# Patient Record
Sex: Male | Born: 1980
Health system: Southern US, Community
[De-identification: ages and names within clinical notes are randomized; demographics above are authoritative.]

## PROBLEM LIST (undated history)

## (undated) DIAGNOSIS — D893 Immune reconstitution syndrome: Secondary | ICD-10-CM

## (undated) DIAGNOSIS — R945 Abnormal results of liver function studies: Secondary | ICD-10-CM

## (undated) DIAGNOSIS — B20 Human immunodeficiency virus [HIV] disease: Secondary | ICD-10-CM

## (undated) DIAGNOSIS — B079 Viral wart, unspecified: Principal | ICD-10-CM

## (undated) DIAGNOSIS — K5900 Constipation, unspecified: Secondary | ICD-10-CM

## (undated) DIAGNOSIS — B59 Pneumocystosis: Secondary | ICD-10-CM

## (undated) DIAGNOSIS — R509 Fever, unspecified: Secondary | ICD-10-CM

## (undated) DIAGNOSIS — R21 Rash and other nonspecific skin eruption: Secondary | ICD-10-CM

## (undated) DIAGNOSIS — R82998 Other abnormal findings in urine: Secondary | ICD-10-CM

## (undated) HISTORY — DX: Abnormal results of liver function studies: R94.5

## (undated) HISTORY — DX: Immune reconstitution syndrome: D89.3

## (undated) HISTORY — DX: Other abnormal findings in urine: R82.998

## (undated) HISTORY — DX: Viral wart, unspecified: B07.9

## (undated) HISTORY — DX: Human immunodeficiency virus (HIV) disease: B20

## (undated) HISTORY — DX: Constipation, unspecified: K59.00

## (undated) HISTORY — DX: Fever, unspecified: R50.9

## (undated) HISTORY — DX: Rash and other nonspecific skin eruption: R21

## (undated) HISTORY — DX: Pneumocystosis: B59

---

## 2006-09-03 ENCOUNTER — Ambulatory Visit: Payer: Self-pay | Admitting: Internal Medicine

## 2006-11-05 ENCOUNTER — Ambulatory Visit: Payer: Self-pay | Admitting: Infectious Diseases

## 2007-02-04 ENCOUNTER — Ambulatory Visit: Payer: Self-pay | Admitting: Internal Medicine

## 2007-05-06 ENCOUNTER — Ambulatory Visit: Payer: Self-pay | Admitting: Infectious Disease

## 2007-07-22 ENCOUNTER — Ambulatory Visit: Payer: Self-pay | Admitting: Infectious Diseases

## 2007-12-02 ENCOUNTER — Ambulatory Visit: Payer: Self-pay | Admitting: Internal Medicine

## 2008-02-03 ENCOUNTER — Ambulatory Visit: Payer: Self-pay | Admitting: Infectious Diseases

## 2008-06-08 ENCOUNTER — Ambulatory Visit: Payer: Self-pay | Admitting: Infectious Diseases

## 2008-08-03 ENCOUNTER — Ambulatory Visit: Payer: Self-pay | Admitting: Infectious Disease

## 2008-09-14 ENCOUNTER — Ambulatory Visit: Payer: Self-pay | Admitting: Internal Medicine

## 2008-12-22 ENCOUNTER — Ambulatory Visit: Payer: Self-pay | Admitting: Internal Medicine

## 2009-03-08 ENCOUNTER — Ambulatory Visit: Payer: Self-pay | Admitting: Internal Medicine

## 2009-05-03 ENCOUNTER — Ambulatory Visit: Payer: Self-pay | Admitting: Infectious Disease

## 2009-09-10 ENCOUNTER — Ambulatory Visit: Payer: Self-pay | Admitting: Internal Medicine

## 2009-12-20 ENCOUNTER — Ambulatory Visit: Payer: Self-pay | Admitting: Infectious Diseases

## 2010-10-18 ENCOUNTER — Other Ambulatory Visit: Payer: Self-pay | Admitting: Internal Medicine

## 2010-10-18 DIAGNOSIS — B20 Human immunodeficiency virus [HIV] disease: Secondary | ICD-10-CM

## 2011-02-26 DIAGNOSIS — B078 Other viral warts: Secondary | ICD-10-CM | POA: Insufficient documentation

## 2011-02-26 DIAGNOSIS — R85619 Unspecified abnormal cytological findings in specimens from anus: Secondary | ICD-10-CM | POA: Insufficient documentation

## 2011-02-26 DIAGNOSIS — B20 Human immunodeficiency virus [HIV] disease: Secondary | ICD-10-CM | POA: Insufficient documentation

## 2011-09-22 DIAGNOSIS — B20 Human immunodeficiency virus [HIV] disease: Secondary | ICD-10-CM

## 2013-05-24 DIAGNOSIS — Z8619 Personal history of other infectious and parasitic diseases: Secondary | ICD-10-CM | POA: Insufficient documentation

## 2014-03-15 DIAGNOSIS — K219 Gastro-esophageal reflux disease without esophagitis: Secondary | ICD-10-CM | POA: Insufficient documentation

## 2015-06-18 ENCOUNTER — Observation Stay (HOSPITAL_COMMUNITY): Payer: Medicaid Other

## 2015-06-18 ENCOUNTER — Inpatient Hospital Stay (HOSPITAL_COMMUNITY)
Admission: EM | Admit: 2015-06-18 | Discharge: 2015-06-21 | DRG: 689 | Disposition: A | Discharge status: Home / Self Care | Payer: Medicaid Other | Attending: Family Medicine | Admitting: Family Medicine

## 2015-06-18 ENCOUNTER — Encounter (HOSPITAL_COMMUNITY): Payer: Self-pay

## 2015-06-18 ENCOUNTER — Emergency Department (HOSPITAL_COMMUNITY): Payer: Medicaid Other

## 2015-06-18 DIAGNOSIS — R630 Anorexia: Secondary | ICD-10-CM | POA: Diagnosis present

## 2015-06-18 DIAGNOSIS — B20 Human immunodeficiency virus [HIV] disease: Secondary | ICD-10-CM | POA: Diagnosis present

## 2015-06-18 DIAGNOSIS — R519 Headache, unspecified: Secondary | ICD-10-CM

## 2015-06-18 DIAGNOSIS — B9620 Unspecified Escherichia coli [E. coli] as the cause of diseases classified elsewhere: Secondary | ICD-10-CM | POA: Diagnosis present

## 2015-06-18 DIAGNOSIS — R51 Headache: Secondary | ICD-10-CM | POA: Diagnosis present

## 2015-06-18 DIAGNOSIS — R634 Abnormal weight loss: Secondary | ICD-10-CM | POA: Diagnosis present

## 2015-06-18 DIAGNOSIS — N12 Tubulo-interstitial nephritis, not specified as acute or chronic: Principal | ICD-10-CM | POA: Diagnosis present

## 2015-06-18 DIAGNOSIS — Z87891 Personal history of nicotine dependence: Secondary | ICD-10-CM

## 2015-06-18 DIAGNOSIS — N390 Urinary tract infection, site not specified: Secondary | ICD-10-CM | POA: Diagnosis present

## 2015-06-18 DIAGNOSIS — Z6820 Body mass index (BMI) 20.0-20.9, adult: Secondary | ICD-10-CM

## 2015-06-18 DIAGNOSIS — Z9114 Patient's other noncompliance with medication regimen: Secondary | ICD-10-CM

## 2015-06-18 HISTORY — DX: Human immunodeficiency virus (HIV) disease: B20

## 2015-06-18 LAB — CBC WITH DIFFERENTIAL/PLATELET
BASOS ABS: 0 10*3/uL (ref 0.0–0.1)
Basophils Relative: 0 %
Eosinophils Absolute: 0 10*3/uL (ref 0.0–0.7)
Eosinophils Relative: 0 %
HEMATOCRIT: 40.5 % (ref 39.0–52.0)
HEMOGLOBIN: 13.8 g/dL (ref 13.0–17.0)
LYMPHS PCT: 12 %
Lymphs Abs: 1.2 10*3/uL (ref 0.7–4.0)
MCH: 32.1 pg (ref 26.0–34.0)
MCHC: 34.1 g/dL (ref 30.0–36.0)
MCV: 94.2 fL (ref 78.0–100.0)
MONO ABS: 0.5 10*3/uL (ref 0.1–1.0)
Monocytes Relative: 5 %
NEUTROS ABS: 8.5 10*3/uL — AB (ref 1.7–7.7)
Neutrophils Relative %: 83 %
Platelets: 167 10*3/uL (ref 150–400)
RBC: 4.3 MIL/uL (ref 4.22–5.81)
RDW: 13 % (ref 11.5–15.5)
WBC: 10.3 10*3/uL (ref 4.0–10.5)

## 2015-06-18 LAB — URINE MICROSCOPIC-ADD ON

## 2015-06-18 LAB — COMPREHENSIVE METABOLIC PANEL
ALK PHOS: 52 U/L (ref 38–126)
ALT: 11 U/L — AB (ref 17–63)
AST: 32 U/L (ref 15–41)
Albumin: 3.4 g/dL — ABNORMAL LOW (ref 3.5–5.0)
Anion gap: 14 (ref 5–15)
BILIRUBIN TOTAL: 0.9 mg/dL (ref 0.3–1.2)
BUN: 11 mg/dL (ref 6–20)
CO2: 26 mmol/L (ref 22–32)
CREATININE: 1.17 mg/dL (ref 0.61–1.24)
Calcium: 9 mg/dL (ref 8.9–10.3)
Chloride: 99 mmol/L — ABNORMAL LOW (ref 101–111)
GFR calc Af Amer: >60 mL/min (ref >60)
Glucose, Bld: 103 mg/dL — ABNORMAL HIGH (ref 65–99)
Potassium: 3.8 mmol/L (ref 3.5–5.1)
Sodium: 139 mmol/L (ref 135–145)
TOTAL PROTEIN: 8.7 g/dL — AB (ref 6.5–8.1)

## 2015-06-18 LAB — URINALYSIS, ROUTINE W REFLEX MICROSCOPIC
Bilirubin Urine: NEGATIVE
GLUCOSE, UA: NEGATIVE mg/dL
KETONES UR: NEGATIVE mg/dL
Nitrite: POSITIVE — AB
PH: 5.5 (ref 5.0–8.0)
PROTEIN: 100 mg/dL — AB
Specific Gravity, Urine: 1.019 (ref 1.005–1.030)

## 2015-06-18 LAB — I-STAT CG4 LACTIC ACID, ED
LACTIC ACID, VENOUS: 0.99 mmol/L (ref 0.5–2.0)
Lactic Acid, Venous: 1.21 mmol/L (ref 0.5–2.0)

## 2015-06-18 MED ORDER — HYDROCODONE-ACETAMINOPHEN 5-325 MG PO TABS
1.0000 | ORAL_TABLET | ORAL | Status: DC | PRN
  Administered 2015-06-19: 1 via ORAL
  Filled 2015-06-18: qty 2

## 2015-06-18 MED ORDER — SODIUM CHLORIDE 0.9 % IV SOLN
INTRAVENOUS | Status: DC
  Administered 2015-06-19 (×2): via INTRAVENOUS

## 2015-06-18 MED ORDER — ACETAMINOPHEN 325 MG PO TABS
650.0000 mg | ORAL_TABLET | Freq: Four times a day (QID) | ORAL | Status: DC | PRN
  Administered 2015-06-20 (×3): 650 mg via ORAL
  Filled 2015-06-18 (×3): qty 2

## 2015-06-18 MED ORDER — IBUPROFEN 800 MG PO TABS
800.0000 mg | ORAL_TABLET | Freq: Once | ORAL | Status: AC
  Administered 2015-06-18: 800 mg via ORAL
  Filled 2015-06-18: qty 1

## 2015-06-18 MED ORDER — SODIUM CHLORIDE 0.9 % IV BOLUS (SEPSIS)
1000.0000 mL | Freq: Once | INTRAVENOUS | Status: AC
  Administered 2015-06-18: 1000 mL via INTRAVENOUS

## 2015-06-18 MED ORDER — DEXTROSE 5 % IV SOLN: 1.0000 g | Freq: Once | INTRAVENOUS | Status: DC

## 2015-06-18 MED ORDER — ONDANSETRON HCL 4 MG PO TABS: 4.0000 mg | ORAL_TABLET | Freq: Four times a day (QID) | ORAL | Status: DC | PRN

## 2015-06-18 MED ORDER — ACETAMINOPHEN 650 MG RE SUPP: 650.0000 mg | Freq: Four times a day (QID) | RECTAL | Status: DC | PRN

## 2015-06-18 MED ORDER — ONDANSETRON HCL 4 MG/2ML IJ SOLN: 4.0000 mg | Freq: Four times a day (QID) | INTRAMUSCULAR | Status: DC | PRN

## 2015-06-18 MED ORDER — ACETAMINOPHEN 325 MG PO TABS
650.0000 mg | ORAL_TABLET | Freq: Once | ORAL | Status: AC
  Administered 2015-06-18: 650 mg via ORAL
  Filled 2015-06-18: qty 2

## 2015-06-18 MED ORDER — DEXTROSE 5 % IV SOLN
1.0000 g | Freq: Once | INTRAVENOUS | Status: AC
  Administered 2015-06-18: 1 g via INTRAVENOUS
  Filled 2015-06-18: qty 10

## 2015-06-18 MED ORDER — CEFTRIAXONE SODIUM 1 G IJ SOLR
1.0000 g | INTRAMUSCULAR | Status: DC
  Administered 2015-06-19 – 2015-06-20 (×2): 1 g via INTRAVENOUS
  Filled 2015-06-18 (×3): qty 10

## 2015-06-18 MED ORDER — SODIUM CHLORIDE 0.9 % IV SOLN: INTRAVENOUS | Status: DC

## 2015-06-18 NOTE — ED Provider Notes (Signed)
CSN: [id number]     Arrival date & time 06/18/15  1 History   First MD Initiated Contact with Patient 06/18/15 2006     Chief Complaint  Patient presents with  . Abdominal Pain  . Headache     (Consider location/radiation/quality/duration/timing/severity/associated sxs/prior Treatment) HPI  Patient presents to the emergency department for suprapubic, abdominal pain and headache, weight loss. He was diagnosed with HIV 8 years ago and has not taken antivirals for the past 4 years. He is treated at [facility] and last had his viral loads checked early this month 2017. He reports being told they were very low. I'm unable to access care everywhere to get the exact number. He says he has not been on antivirals because he does not like the side effects. He developed headache two weeks ago, dysuria 1 week ago and headache 2 days ago. He reports 15 lb weight loss between November and January. His providers have strongly encouraged him to start HIV medication but he has not felt comfortable with it and refuses. He says that he knows a lot of new medications are out but he see's a different provider every visit and doesn't have trust. He is looking to switch to Dr. Tommy Medal in [place] for continuity or provider. The patient is febrile at 101.4 and tachycardic at 122 on arrival. PCP: No PCP Per Patient  Brandon Hernandez is a 35 y.o.  male  ROS: The patient denies diaphoresis,weakness ( focal), confusion, change of vision,  dysphagia, aphagia, shortness of breath,  nausea, vomiting, diarrhea, lower extremity swelling, rash, neck pain, chest pain, penile drainage.   Past Medical History  Diagnosis Date  . HIV disease ([place])    History reviewed. No pertinent past surgical history. History reviewed. No pertinent family history. Social History  Substance Use Topics  . Smoking status: Former Research scientist (life sciences)  . Smokeless tobacco: None  . Alcohol Use: No    Review of Systems  Review of Systems All other  systems negative except as documented in the HPI. All pertinent positives and negatives as reviewed in the HPI.   Allergies  Review of patient's allergies indicates no known allergies.  Home Medications   Prior to Admission medications   Not on File   BP 105/79 mmHg  Pulse 91  Temp(Src) 101.6 F (38.7 C) (Oral)  Resp 18  SpO2 98% Physical Exam  Constitutional: He appears well-developed and well-nourished. No distress.  HENT:  Head: Normocephalic and atraumatic.  Right Ear: Tympanic membrane and ear canal normal.  Left Ear: Tympanic membrane and ear canal normal.  Nose: Nose normal.  Mouth/Throat: Uvula is midline, oropharynx is clear and moist and mucous membranes are normal.  Eyes: Pupils are equal, round, and reactive to light.  Neck: Normal range of motion. Neck supple.  Cardiovascular: Normal rate and regular rhythm.   Pulmonary/Chest: Effort normal.  Abdominal: Soft. Bowel sounds are normal. He exhibits no distension. There is tenderness in the epigastric area. There is no rigidity, no rebound, no guarding and no CVA tenderness.  No signs of abdominal distention  Musculoskeletal:  No LE swelling  Neurological: He is alert.  Acting at baseline  Skin: Skin is warm and dry. No rash noted.  Nursing note and vitals reviewed.   ED Course  Procedures (including critical care time) Labs Review Labs Reviewed  CBC WITH DIFFERENTIAL/PLATELET - Abnormal; Notable for the following:    Neutro Abs 8.5 (*)    All other components within normal limits  COMPREHENSIVE  METABOLIC PANEL - Abnormal; Notable for the following:    Chloride 99 (*)    Glucose, Bld 103 (*)    Total Protein 8.7 (*)    Albumin 3.4 (*)    ALT 11 (*)    All other components within normal limits  URINALYSIS, ROUTINE W REFLEX MICROSCOPIC (NOT AT [facility]) - Abnormal; Notable for the following:    APPearance CLOUDY (*)    Hgb urine dipstick SMALL (*)    Protein, ur 100 (*)    Nitrite POSITIVE (*)     Leukocytes, UA SMALL (*)    All other components within normal limits  URINE MICROSCOPIC-ADD ON - Abnormal; Notable for the following:    Squamous Epithelial / LPF 0-5 (*)    Bacteria, UA MANY (*)    Casts HYALINE CASTS (*)    All other components within normal limits  CULTURE, BLOOD (ROUTINE X 2)  CULTURE, BLOOD (ROUTINE X 2)  URINE CULTURE  T-HELPER CELLS (CD4) COUNT (NOT AT [facility])  I-STAT CG4 LACTIC ACID, ED  I-STAT CG4 LACTIC ACID, ED    Imaging Review Dg Chest 2 View  06/18/2015  CLINICAL DATA:  febrile, immune deficient. Chest pain, cough and fever X 2 days with nausea EXAM: CHEST  2 VIEW COMPARISON:  None. FINDINGS: Heart size is normal. Mild pectus deformity accentuates bronchovascular markings in the region of the right middle lobe. There are no focal consolidations or pleural effusions. No pulmonary edema. IMPRESSION: No evidence for acute cardiopulmonary abnormality. Electronically Signed   By: Nolon Nations M.D.   On: 06/18/2015 21:20   I have personally reviewed and evaluated these images and lab results as part of my medical decision-making.   EKG Interpretation None      MDM   Final diagnoses:  HIV (human immunodeficiency virus infection) ([place])  Complicated UTI (urinary tract infection)    Pt has UTI, immunocompromised with an unknown low CD4 count. No Care Everywhere but the patient says he goes to [facility]. It was confirmed by previous charts that he was treated by Dr. Lucianne Lei Dam/colleagues from 2008-2011 here in [place] on chart review.   Medications  sodium chloride 0.9 % bolus 1,000 mL (1,000 mLs Intravenous New Bag/Given 06/18/15 2125)  0.9 %  sodium chloride infusion (not administered)  ibuprofen (ADVIL,MOTRIN) tablet 800 mg (not administered)  sodium chloride 0.9 % bolus 1,000 mL (0 mLs Intravenous Stopped 06/18/15 2120)  acetaminophen (TYLENOL) tablet 650 mg (650 mg Oral Given 06/18/15 2025)  cefTRIAXone (ROCEPHIN) 1 g in dextrose 5 % 50 mL IVPB (0 g  Intravenous Stopped 06/18/15 2120)    Will require admission, unassigned, [facility] hospitalist, [facility] admits, Dr. Saverio Danker, A. Inpatient/ [place].  Filed Vitals:   06/18/15 2100 06/18/15 2130  BP: 104/68 105/79  Pulse: 90 91  Temp:    Resp:          Delos Haring, PA-C 06/18/15 [place], MD 06/19/15 [phone]

## 2015-06-18 NOTE — H&P (Addendum)
PCP: No PCP Per Patient  ID at [facility]   Referring provider Tiffany PA   Chief Complaint:  fever, abdominal pain HPI: Brandon Hernandez is a 35 y.o. male   has a past medical history of HIV disease ([place]).   Presented with abdominal pain and fevers x 1 day. Report headache for the past week. No neck pain, mild photophobia. Headache worse when trying to stand up. He is going to beauty school.  He has known history of HIV diagnosed 8 years ago not on any medications patient does state he is not complying due to side effects. Noticed burning with urination. Decreased PO intake. Reports 30 lb weight loss.     Hospitalist was called for admission for suspected AIDS and UTI  Review of Systems:    Pertinent positives include: Fevers, chills, fatigue, weight loss abdominal pain,    Constitutional:  No weight loss, night sweats  HEENT:  No headaches, Difficulty swallowing,Tooth/dental problems,Sore throat,  No sneezing, itching, ear ache, nasal congestion, post nasal drip,  Cardio-vascular:  No chest pain, Orthopnea, PND, anasarca, dizziness, palpitations.no Bilateral lower extremity swelling  GI:  No heartburn, indigestion,  change in bowel habits, loss of appetite, melena, blood in stool, hematemesis Resp:  no shortness of breath at rest. No dyspnea on exertion, No excess mucus, no productive cough, No non-productive cough, No coughing up of blood.No change in color of mucus.No wheezing. Skin:  no rash or lesions. No jaundice GU:  no dysuria, change in color of urine, no urgency or frequency. No straining to urinate.  No flank pain.  Musculoskeletal:  No joint pain or no joint swelling. No decreased range of motion. No back pain.  Psych:  No change in mood or affect. No depression or anxiety. No memory loss.  Neuro: no localizing neurological complaints, no tingling, no weakness, no double vision, no gait abnormality, no slurred speech, no confusion  Otherwise ROS are negative  except for above, 10 systems were reviewed  Past Medical History: Past Medical History  Diagnosis Date  . HIV disease ([place])    History reviewed. No pertinent past surgical history.   Medications: Prior to Admission medications   Not on File    Allergies:  No Known Allergies  Social History:  Ambulatory  independently  Lives at home alone,    reports that he has quit smoking. He does not have any smokeless tobacco history on file. He reports that he does not drink alcohol or use illicit drugs.    Family History: family history includes Cancer in his father and mother; Diabetes in his sister.    Physical Exam: Patient Vitals for the past 24 hrs:  BP Temp Temp src Pulse Resp SpO2  06/18/15 2130 105/79 mmHg - - 91 - 98 %  06/18/15 2100 104/68 mmHg - - 90 - 97 %  06/18/15 2045 113/74 mmHg - - 95 - 97 %  06/18/15 2030 105/73 mmHg - - 94 - 98 %  06/18/15 2015 105/75 mmHg - - 97 - 98 %  06/18/15 2000 109/68 mmHg - - 97 - 96 %  06/18/15 1945 108/73 mmHg - - 98 - 96 %  06/18/15 1930 111/77 mmHg - - 98 - 98 %  06/18/15 1853 106/69 mmHg 101.6 F (38.7 C) Oral 113 18 98 %  06/18/15 1556 106/72 mmHg 101.4 F (38.6 C) Oral (!) 122 18 96 %    1. General:  in No Acute distress 2. Psychological: Alert and Oriented 3.  Head/ENT:    Dry Mucous Membranes                          Head Non traumatic, neck supple                          Normal   Dentition 4. SKIN:   decreased Skin turgor,  Skin clean Dry and intact no rash 5. Heart: Regular rate and rhythm no Murmur, Rub or gallop 6. Lungs: Clear to auscultation bilaterally, no wheezes or crackles   7. Abdomen: Soft, non-tender, Non distended 8. Lower extremities: no clubbing, cyanosis, or edema 9. Neurologically Grossly intact, moving all 4 extremities equally 10. MSK: Normal range of motion  body mass index is unknown because there is no height or weight on file.   Labs on Admission:   Results for orders placed or  performed during the hospital encounter of 06/18/15 (from the past 24 hour(s))  CBC with Differential     Status: Abnormal   Collection Time: 06/18/15  4:19 PM  Result Value Ref Range   WBC 10.3 4.0 - 10.5 K/uL   RBC 4.30 4.22 - 5.81 MIL/uL   Hemoglobin 13.8 13.0 - 17.0 g/dL   HCT 40.5 39.0 - 52.0 %   MCV 94.2 78.0 - 100.0 fL   MCH 32.1 26.0 - 34.0 pg   MCHC 34.1 30.0 - 36.0 g/dL   RDW 13.0 11.5 - 15.5 %   Platelets 167 150 - 400 K/uL   Neutrophils Relative % 83 %   Neutro Abs 8.5 (H) 1.7 - 7.7 K/uL   Lymphocytes Relative 12 %   Lymphs Abs 1.2 0.7 - 4.0 K/uL   Monocytes Relative 5 %   Monocytes Absolute 0.5 0.1 - 1.0 K/uL   Eosinophils Relative 0 %   Eosinophils Absolute 0.0 0.0 - 0.7 K/uL   Basophils Relative 0 %   Basophils Absolute 0.0 0.0 - 0.1 K/uL  Comprehensive metabolic panel     Status: Abnormal   Collection Time: 06/18/15  4:19 PM  Result Value Ref Range   Sodium 139 135 - 145 mmol/L   Potassium 3.8 3.5 - 5.1 mmol/L   Chloride 99 (L) 101 - 111 mmol/L   CO2 26 22 - 32 mmol/L   Glucose, Bld 103 (H) 65 - 99 mg/dL   BUN 11 6 - 20 mg/dL   Creatinine, Ser 1.17 0.61 - 1.24 mg/dL   Calcium 9.0 8.9 - 10.3 mg/dL   Total Protein 8.7 (H) 6.5 - 8.1 g/dL   Albumin 3.4 (L) 3.5 - 5.0 g/dL   AST 32 15 - 41 U/L   ALT 11 (L) 17 - 63 U/L   Alkaline Phosphatase 52 38 - 126 U/L   Total Bilirubin 0.9 0.3 - 1.2 mg/dL   GFR calc non Af Amer >60 >60 mL/min   GFR calc Af Amer >60 >60 mL/min   Anion gap 14 5 - 15  I-Stat CG4 Lactic Acid, ED     Status: None   Collection Time: 06/18/15  4:19 PM  Result Value Ref Range   Lactic Acid, Venous 0.99 0.5 - 2.0 mmol/L  Urinalysis, Routine w reflex microscopic (not at [facility])     Status: Abnormal   Collection Time: 06/18/15  4:20 PM  Result Value Ref Range   Color, Urine YELLOW YELLOW   APPearance CLOUDY (A) CLEAR   Specific Gravity, Urine 1.019 1.005 -  1.030   pH 5.5 5.0 - 8.0   Glucose, UA NEGATIVE NEGATIVE mg/dL   Hgb urine dipstick  SMALL (A) NEGATIVE   Bilirubin Urine NEGATIVE NEGATIVE   Ketones, ur NEGATIVE NEGATIVE mg/dL   Protein, ur 100 (A) NEGATIVE mg/dL   Nitrite POSITIVE (A) NEGATIVE   Leukocytes, UA SMALL (A) NEGATIVE  Urine microscopic-add on     Status: Abnormal   Collection Time: 06/18/15  4:20 PM  Result Value Ref Range   Squamous Epithelial / LPF 0-5 (A) NONE SEEN   WBC, UA 6-30 0 - 5 WBC/hpf   RBC / HPF 0-5 0 - 5 RBC/hpf   Bacteria, UA MANY (A) NONE SEEN   Casts HYALINE CASTS (A) NEGATIVE  I-Stat CG4 Lactic Acid, ED     Status: None   Collection Time: 06/18/15  7:44 PM  Result Value Ref Range   Lactic Acid, Venous 1.21 0.5 - 2.0 mmol/L    UA 6-30 white blood cells many bacteria and positive nitrite  No results found for: HGBA1C  CrCl cannot be calculated (Unknown ideal weight.).  BNP (last 3 results) No results for input(s): PROBNP in the last 8760 hours.  Other results:  I have pearsonaly reviewed this: ECG REPORT  Not obtained   There were no vitals filed for this visit.   Cultures: No results found for: [place], [place], CULT, REPTSTATUS   Radiological Exams on Admission: Dg Chest 2 View  06/18/2015  CLINICAL DATA:  febrile, immune deficient. Chest pain, cough and fever X 2 days with nausea EXAM: CHEST  2 VIEW COMPARISON:  None. FINDINGS: Heart size is normal. Mild pectus deformity accentuates bronchovascular markings in the region of the right middle lobe. There are no focal consolidations or pleural effusions. No pulmonary edema. IMPRESSION: No evidence for acute cardiopulmonary abnormality. Electronically Signed   By: Nolon Nations M.D.   On: 06/18/2015 21:20    Chart has been reviewed  Family   at  Bedside    Assessment/Plan  35 year old gentleman with history of HIV untreated likely AIDS presents with fevers chills headache abdominal pain evidence of UTI, ID aware will see in consult tomorrow  Present on Admission:  . AIDS (acquired immune deficiency syndrome)  ([place]) - discussed with infectious disease at this point patient isn't interested in taking medications. We will obtain CD4 count and HIV viral load,  infectious disease consult in AM Headache no evidence of Meningitis or encephalitis will obtain CT of the head and rehydrate, check cryptococcal antigen . Complicated UTI (urinary tract infection) - in the setting of immunocompromise    Prophylaxis: SCD   CODE STATUS:  FULL CODE   as per patient   Disposition:  To home once workup is complete and patient is stable  Other plan as per orders.  I have spent a total of 63 min on this admission extra time was spent to discuss case with ID dr. Mertie Moores 06/18/2015, 11:09 PM    [facility]  Pager [phone]   after 2 AM please page floor coverage PA If 7AM-7PM, please contact the day team taking care of the patient  [email or web address]  Password [email or web address]

## 2015-06-18 NOTE — ED Notes (Signed)
Patient here with 1 week of abdominal pain and light headedness, reports some nausea with same.  Patient states that he has HIV and takes no medication.

## 2015-06-19 ENCOUNTER — Ambulatory Visit: Payer: Self-pay | Admitting: *Deleted

## 2015-06-19 DIAGNOSIS — R51 Headache: Secondary | ICD-10-CM | POA: Diagnosis present

## 2015-06-19 DIAGNOSIS — B20 Human immunodeficiency virus [HIV] disease: Secondary | ICD-10-CM

## 2015-06-19 DIAGNOSIS — Z87891 Personal history of nicotine dependence: Secondary | ICD-10-CM | POA: Diagnosis not present

## 2015-06-19 DIAGNOSIS — R634 Abnormal weight loss: Secondary | ICD-10-CM | POA: Diagnosis present

## 2015-06-19 DIAGNOSIS — B9689 Other specified bacterial agents as the cause of diseases classified elsewhere: Secondary | ICD-10-CM

## 2015-06-19 DIAGNOSIS — Z6820 Body mass index (BMI) 20.0-20.9, adult: Secondary | ICD-10-CM | POA: Diagnosis not present

## 2015-06-19 DIAGNOSIS — B9620 Unspecified Escherichia coli [E. coli] as the cause of diseases classified elsewhere: Secondary | ICD-10-CM | POA: Diagnosis present

## 2015-06-19 DIAGNOSIS — N390 Urinary tract infection, site not specified: Secondary | ICD-10-CM | POA: Diagnosis not present

## 2015-06-19 DIAGNOSIS — Z9114 Patient's other noncompliance with medication regimen: Secondary | ICD-10-CM | POA: Diagnosis not present

## 2015-06-19 DIAGNOSIS — N12 Tubulo-interstitial nephritis, not specified as acute or chronic: Secondary | ICD-10-CM | POA: Diagnosis present

## 2015-06-19 DIAGNOSIS — R630 Anorexia: Secondary | ICD-10-CM | POA: Diagnosis present

## 2015-06-19 DIAGNOSIS — Z21 Asymptomatic human immunodeficiency virus [HIV] infection status: Secondary | ICD-10-CM

## 2015-06-19 LAB — MAGNESIUM: MAGNESIUM: 2.2 mg/dL (ref 1.7–2.4)

## 2015-06-19 LAB — T-HELPER CELLS (CD4) COUNT (NOT AT ARMC)
CD4 % Helper T Cell: 2 % — ABNORMAL LOW (ref 33–55)
CD4 T Cell Abs: 20 /uL — ABNORMAL LOW (ref 400–2700)

## 2015-06-19 LAB — CBC
HCT: 37.7 % — ABNORMAL LOW (ref 39.0–52.0)
HEMATOCRIT: 33.4 % — AB (ref 39.0–52.0)
Hemoglobin: 13.3 g/dL (ref 13.0–17.0)
Hemoglobin: 11.2 g/dL — ABNORMAL LOW (ref 13.0–17.0)
MCH: 33.1 pg (ref 26.0–34.0)
MCH: 31.6 pg (ref 26.0–34.0)
MCHC: 35.3 g/dL (ref 30.0–36.0)
MCHC: 33.5 g/dL (ref 30.0–36.0)
MCV: 93.8 fL (ref 78.0–100.0)
MCV: 94.4 fL (ref 78.0–100.0)
PLATELETS: 151 10*3/uL (ref 150–400)
Platelets: 141 10*3/uL — ABNORMAL LOW (ref 150–400)
RBC: 4.02 MIL/uL — ABNORMAL LOW (ref 4.22–5.81)
RBC: 3.54 MIL/uL — ABNORMAL LOW (ref 4.22–5.81)
RDW: 13.2 % (ref 11.5–15.5)
RDW: 13 % (ref 11.5–15.5)
WBC: 5.8 10*3/uL (ref 4.0–10.5)
WBC: 4.4 10*3/uL (ref 4.0–10.5)

## 2015-06-19 LAB — COMPREHENSIVE METABOLIC PANEL
ALT: 10 U/L — ABNORMAL LOW (ref 17–63)
ANION GAP: 7 (ref 5–15)
AST: 29 U/L (ref 15–41)
Albumin: 2.7 g/dL — ABNORMAL LOW (ref 3.5–5.0)
Alkaline Phosphatase: 42 U/L (ref 38–126)
BILIRUBIN TOTAL: 0.7 mg/dL (ref 0.3–1.2)
BUN: 11 mg/dL (ref 6–20)
CALCIUM: 7.8 mg/dL — AB (ref 8.9–10.3)
CO2: 27 mmol/L (ref 22–32)
CREATININE: 0.92 mg/dL (ref 0.61–1.24)
Chloride: 109 mmol/L (ref 101–111)
GLUCOSE: 83 mg/dL (ref 65–99)
Potassium: 3.7 mmol/L (ref 3.5–5.1)
Sodium: 143 mmol/L (ref 135–145)
Total Protein: 7.5 g/dL (ref 6.5–8.1)

## 2015-06-19 LAB — CRYPTOCOCCAL ANTIGEN: CRYPTO AG: NEGATIVE

## 2015-06-19 LAB — PHOSPHORUS: PHOSPHORUS: 3 mg/dL (ref 2.5–4.6)

## 2015-06-19 LAB — TSH: TSH: 7.893 u[IU]/mL — AB (ref 0.350–4.500)

## 2015-06-19 MED ORDER — SODIUM CHLORIDE 0.9 % IV SOLN
INTRAVENOUS | Status: DC
  Administered 2015-06-19 – 2015-06-20 (×2): via INTRAVENOUS

## 2015-06-19 MED ORDER — WHITE PETROLATUM GEL
Status: AC
  Filled 2015-06-19: qty 1

## 2015-06-19 NOTE — Consult Note (Signed)
[place] for Infectious Disease       Reason for Consult: HIV/AIDS    Referring Physician: Dr. Waldron Labs  Active Problems:   AIDS (acquired immune deficiency syndrome) ([place])   UTI (lower urinary tract infection)   Complicated UTI (urinary tract infection)   . cefTRIAXone (ROCEPHIN)  IV  1 g Intravenous Q24H    Recommendations: Treat UTI 5 days with oral therapy once organism known We will have our case manager get him in for HIV/AIDS care   Assessment: He has AIDS with a reported CD4 of 10 last month at [facility].  Nothing in [place] so may be false or he may be under a different name at [facility].  Regardless, he prefers his care here since he is moving here and we will get him into our clinic.   uTI with increased WBCs, symptoms, bacteria, now improving  Antibiotics: ceftriaxone  HPI: Brandon Hernandez is a 35 y.o. male with HIV, likely AIDs with a self-reported CD4 of 10 last month.  He has been on medications in the past, though does not remember any names, will take them for a month or two and stop due to headache. He also tells me he is moving here to [place], though will be staying with his sister in [place] temporarily.  He is currently jobless.  He reports a recent 30 lb weight loss.  No diarrhea.  Came in with dysuria, headache, feels better.  No discharge.  Reports one stable partner for 2 years.  Does not want to return to [facility] since he does not like having a different provider every time.   CT scan head independently reviewed and no significant findings noted.  Review of Systems:  Constitutional: negative for chills and malaise Cardiovascular: negative for chest pressure/discomfort Gastrointestinal: negative for nausea and diarrhea; no dysphagia Integument/breast: negative for rash All other systems reviewed and are negative   Past Medical History  Diagnosis Date  . HIV disease [facility])     Social History  Substance Use Topics  . Smoking  status: Former Research scientist (life sciences)  . Smokeless tobacco: None  . Alcohol Use: No    Family History  Problem Relation Age of Onset  . Cancer Mother   . Cancer Father   . Diabetes Sister     No Known Allergies  Physical Exam: Constitutional: in no apparent distress and alert  Filed Vitals:   06/19/15 0412 06/19/15 0653  BP: 103/52   Pulse: 51   Temp: 91.9 F (33.3 C) 95.9 F (35.5 C)  Resp: 16    EYES: anicteric ENMT: no thrush Cardiovascular: Cor RRR and No murmurs Respiratory: CTA B; normal respiratory effort GI: Bowel sounds are normal, liver is not enlarged, spleen is not enlarged Musculoskeletal: peripheral pulses normal, no pedal edema, no clubbing or cyanosis Skin: negatives: no rash Hematologic: no cervical lad  Lab Results  Component Value Date   WBC 5.8 06/19/2015   HGB 13.3 06/19/2015   HCT 37.7* 06/19/2015   MCV 93.8 06/19/2015   PLT 141* 06/19/2015    Lab Results  Component Value Date   CREATININE 0.92 06/19/2015   BUN 11 06/19/2015   NA 143 06/19/2015   K 3.7 06/19/2015   CL 109 06/19/2015   CO2 27 06/19/2015    Lab Results  Component Value Date   ALT 10* 06/19/2015   AST 29 06/19/2015   ALKPHOS 42 06/19/2015     Microbiology: Recent Results (from the past 240 hour(s))  Urine  culture     Status: None (Preliminary result)   Collection Time: 06/18/15  4:20 PM  Result Value Ref Range Status   Specimen Description URINE, RANDOM  Final   Special Requests Immunocompromised  Final   Culture TOO YOUNG TO READ  Final   Report Status PENDING  Incomplete    Scharlene Gloss, [place] for Infectious [email or web address] [phone] pager  [phone] cell 06/19/2015, 1:03 PM

## 2015-06-19 NOTE — Progress Notes (Signed)
Unable to obtain  temperature orally and axillary,rectal temp 95.9, sweating profusely, urine output 200 cc only. Md on call paged, awaiting response.

## 2015-06-19 NOTE — Progress Notes (Signed)
Patient Demographics  Brandon Hernandez, is a 35 y.o. male, DOB - 1980-10-14, [id number]  Admit date - 06/18/2015   Admitting Physician Toy Baker, MD  Outpatient Primary MD for the patient is No PCP Per Patient  LOS - 1   Chief Complaint  Patient presents with  . Abdominal Pain  . Headache       Admission HPI/Brief narrative: 35 year old male with known HIV, possibly AIDS, followed at [facility], but receiving any treatment, resents with fatigue and fever, workup significant for UTI, followed by ID. Subjective:   Arnette Felts today has, complains of fatigue, sweating, and poor appetite   Assessment & Plan    Active Problems:   AIDS (acquired immune deficiency syndrome) (HCC)   UTI (lower urinary tract infection)   Complicated UTI (urinary tract infection)  HIV/AIDS  - ID consult greatly appreciated, patient reports CD4 of 10 , no records at care everywhere . - Patient preferred to start following at our ID clinic . - CD4 count of 20  - HIV quantitative pending   UTI  - January the IV Rocephin, follow on urine culture, then to continue by mouth antibiotic once organism known as per ID recommendation .  Code Status: Full  Family Communication: none at bedside  Disposition Plan: home when stable   Procedures  none   Consults   ID   Medications  Scheduled Meds: . cefTRIAXone (ROCEPHIN)  IV  1 g Intravenous Q24H   Continuous Infusions:  PRN Meds:.acetaminophen **OR** acetaminophen, HYDROcodone-acetaminophen, ondansetron **OR** ondansetron (ZOFRAN) IV  DVT Prophylaxis  SCDs   Lab Results  Component Value Date   PLT 141* 06/19/2015    Antibiotics   Anti-infectives    Start     Dose/Rate Route Frequency Ordered Stop   06/19/15 2000  cefTRIAXone (ROCEPHIN) 1 g in dextrose 5 % 50 mL IVPB     1 g 100 mL/hr over 30 Minutes Intravenous Every 24  hours 06/18/15 2315     06/18/15 2030  cefTRIAXone (ROCEPHIN) 1 g in dextrose 5 % 50 mL IVPB  Status:  Discontinued     1 g 100 mL/hr over 30 Minutes Intravenous  Once 06/18/15 2016 06/18/15 2020   06/18/15 2030  cefTRIAXone (ROCEPHIN) 1 g in dextrose 5 % 50 mL IVPB     1 g 100 mL/hr over 30 Minutes Intravenous  Once 06/18/15 2021 06/18/15 2120          Objective:   Filed Vitals:   06/18/15 2319 06/19/15 0412 06/19/15 0653 06/19/15 1533  BP: 106/51 103/52  105/64  Pulse: 98 51  92  Temp:  91.9 F (33.3 C) 95.9 F (35.5 C) 101.6 F (38.7 C)  TempSrc:  Oral Rectal Oral  Resp:  16  18  Height:      Weight:      SpO2:  100%  98%    Wt Readings from Last 3 Encounters:  06/18/15 65.318 kg (144 lb)     Intake/Output Summary (Last 24 hours) at 06/19/15 1608 Last data filed at 06/19/15 0640  Gross per 24 hour  Intake      0 ml  Output    675 ml  Net   -675 ml  Physical Exam  Awake Alert, Oriented X 3, No new F.N deficits, Normal affect [place].AT,PERRAL Supple Neck,No JVD,  Symmetrical Chest wall movement, Good air movement bilaterally, CTAB RRR,No Gallops,Rubs or new Murmurs, No Parasternal Heave +ve B.Sounds, Abd Soft, No tenderness, No organomegaly appriciated, No rebound - guarding or rigidity. No Cyanosis, Clubbing or edema, No new Rash or bruise     Data Review   Micro Results Recent Results (from the past 240 hour(s))  Urine culture     Status: None (Preliminary result)   Collection Time: 06/18/15  4:20 PM  Result Value Ref Range Status   Specimen Description URINE, RANDOM  Final   Special Requests Immunocompromised  Final   Culture TOO YOUNG TO READ  Final   Report Status PENDING  Incomplete  Culture, blood (Routine X 2) w Reflex to ID Panel     Status: None (Preliminary result)   Collection Time: 06/18/15  8:22 PM  Result Value Ref Range Status   Specimen Description BLOOD RIGHT ANTECUBITAL  Final   Special Requests BOTTLES DRAWN AEROBIC ONLY 10CC   Final   Culture NO GROWTH < 24 HOURS  Final   Report Status PENDING  Incomplete  Culture, blood (Routine X 2) w Reflex to ID Panel     Status: None (Preliminary result)   Collection Time: 06/18/15  8:26 PM  Result Value Ref Range Status   Specimen Description BLOOD LEFT ANTECUBITAL  Final   Special Requests BOTTLES DRAWN AEROBIC ONLY 10CC  Final   Culture NO GROWTH < 24 HOURS  Final   Report Status PENDING  Incomplete    Radiology Reports Dg Chest 2 View  06/18/2015  CLINICAL DATA:  febrile, immune deficient. Chest pain, cough and fever X 2 days with nausea EXAM: CHEST  2 VIEW COMPARISON:  None. FINDINGS: Heart size is normal. Mild pectus deformity accentuates bronchovascular markings in the region of the right middle lobe. There are no focal consolidations or pleural effusions. No pulmonary edema. IMPRESSION: No evidence for acute cardiopulmonary abnormality. Electronically Signed   By: Nolon Nations M.D.   On: 06/18/2015 21:20   Ct Head Wo Contrast  06/19/2015  CLINICAL DATA:  Headaches and photophobia, worse when standing. History of HIV. 30 pound weight loss. EXAM: CT HEAD WITHOUT CONTRAST TECHNIQUE: Contiguous axial images were obtained from the base of the skull through the vertex without intravenous contrast. COMPARISON:  None. FINDINGS: Ventricles and sulci appear symmetrical. No mass effect or midline shift. No abnormal extra-axial fluid collections. Gray-white matter junctions are distinct. Basal cisterns are not effaced. No evidence of acute intracranial hemorrhage. No depressed skull fractures. Opacification of bilateral ethmoid air cells. Mastoid air cells are not opacified. IMPRESSION: No acute intracranial abnormalities. Opacification of scattered bilateral ethmoid air cells. Electronically Signed   By: Lucienne Capers M.D.   On: 06/19/2015 00:03     CBC  Recent Labs Lab 06/18/15 1619 06/19/15 0637  WBC 10.3 5.8  HGB 13.8 13.3  HCT 40.5 37.7*  PLT 167 141*  MCV 94.2  93.8  MCH 32.1 33.1  MCHC 34.1 35.3  RDW 13.0 13.2  LYMPHSABS 1.2  --   MONOABS 0.5  --   EOSABS 0.0  --   BASOSABS 0.0  --     Chemistries   Recent Labs Lab 06/18/15 1619 06/19/15 0637  NA 139 143  K 3.8 3.7  CL 99* 109  CO2 26 27  GLUCOSE 103* 83  BUN 11 11  CREATININE 1.17 0.92  CALCIUM 9.0 7.8*  MG  --  2.2  AST 32 29  ALT 11* 10*  ALKPHOS 52 42  BILITOT 0.9 0.7   ------------------------------------------------------------------------------------------------------------------ estimated creatinine clearance is 106.5 mL/min (by C-G formula based on Cr of 0.92). ------------------------------------------------------------------------------------------------------------------ No results for input(s): HGBA1C in the last 72 hours. ------------------------------------------------------------------------------------------------------------------ No results for input(s): CHOL, HDL, LDLCALC, TRIG, CHOLHDL, LDLDIRECT in the last 72 hours. ------------------------------------------------------------------------------------------------------------------  Recent Labs  06/19/15 0637  TSH 7.893*   ------------------------------------------------------------------------------------------------------------------ No results for input(s): VITAMINB12, FOLATE, FERRITIN, TIBC, IRON, RETICCTPCT in the last 72 hours.  Coagulation profile No results for input(s): INR, PROTIME in the last 168 hours.  No results for input(s): DDIMER in the last 72 hours.  Cardiac Enzymes No results for input(s): CKMB, TROPONINI, MYOGLOBIN in the last 168 hours.  Invalid input(s): CK ------------------------------------------------------------------------------------------------------------------ Invalid input(s): POCBNP     Time Spent in minutes   25 Lacinda Axon, Deniya Craigo M.D on 06/19/2015 at 4:08 PM  Between 7am to 7pm - Pager - [phone]  After 7pm go to [email or web address] - password  [facility]  [facility]   Office  [phone]

## 2015-06-20 DIAGNOSIS — B9620 Unspecified Escherichia coli [E. coli] as the cause of diseases classified elsewhere: Secondary | ICD-10-CM

## 2015-06-20 DIAGNOSIS — Z9114 Patient's other noncompliance with medication regimen: Secondary | ICD-10-CM

## 2015-06-20 DIAGNOSIS — R509 Fever, unspecified: Secondary | ICD-10-CM

## 2015-06-20 LAB — HIV-1 RNA QUANT-NO REFLEX-BLD
HIV 1 RNA Quant: 743000 copies/mL
LOG10 HIV-1 RNA: 5.871 {Log_copies}/mL

## 2015-06-20 LAB — BASIC METABOLIC PANEL
ANION GAP: 7 (ref 5–15)
BUN: 5 mg/dL — ABNORMAL LOW (ref 6–20)
CALCIUM: 7.8 mg/dL — AB (ref 8.9–10.3)
CHLORIDE: 103 mmol/L (ref 101–111)
CO2: 25 mmol/L (ref 22–32)
Creatinine, Ser: 1.01 mg/dL (ref 0.61–1.24)
GFR calc non Af Amer: >60 mL/min (ref >60)
GLUCOSE: 92 mg/dL (ref 65–99)
POTASSIUM: 3.6 mmol/L (ref 3.5–5.1)
Sodium: 135 mmol/L (ref 135–145)

## 2015-06-20 LAB — HEPATITIS PANEL, ACUTE
HCV Ab: 0.1 s/co ratio (ref 0.0–0.9)
Hep A IgM: NEGATIVE
Hep B C IgM: NEGATIVE
Hepatitis B Surface Ag: NEGATIVE

## 2015-06-20 NOTE — Progress Notes (Signed)
[facility] PROGRESS NOTE  Brandon Hernandez [id number] DOB: [birth year] DOA: 06/18/2015 PCP: No PCP Per Patient  Assessment/Plan: 1. AIDS- patient's CD4 count is 20, viral load 743,000. Patient now agrees to take antiretroviral therapy. Will follow up with ID as outpatient 2. UTI- urine culture is growing Escherichia coli, continue with IV Rocephin. We will follow the urine culture results. 3. DVT prophylaxis-  SCD  Code Status: Full code Family Communication: No family at bedside Disposition Plan: Home when culture results back   Consultants:  Infectious disease  Procedures:  None  Antibiotics:  Ceftriaxone  HPI/Subjective: 35 year old male with known HIV, possibly AIDS, followed at [facility], but receiving any treatment, resents with fatigue and fever, workup significant for UTI, followed by ID.  Patient complains of intermittent headache. Denies headache at this time. CT head negative for acute abnormality   Objective: Filed Vitals:   06/20/15 0543 06/20/15 1012  BP:  103/47  Pulse:  83  Temp: 102.6 F (39.2 C) 98 F (36.7 C)  Resp:  18    Intake/Output Summary (Last 24 hours) at 06/20/15 1558 Last data filed at 06/20/15 1014  Gross per 24 hour  Intake   2115 ml  Output    600 ml  Net   1515 ml   Filed Weights   06/18/15 2311  Weight: 65.318 kg (144 lb)    Exam:   General:  Appears in no acute distress  Cardiovascular: S1-S2 regular  Respiratory: Clear to auscultation bilaterally  Abdomen: Soft, nontender, no organomegaly  Musculoskeletal: No cyanosis/clubbing/edema. Lower extremities   Data Reviewed: Basic Metabolic Panel:  Recent Labs Lab 06/18/15 1619 06/19/15 0637 06/20/15 0348  NA 139 143 135  K 3.8 3.7 3.6  CL 99* 109 103  CO2 26 27 25   GLUCOSE 103* 83 92  BUN 11 11 5*  CREATININE 1.17 0.92 1.01  CALCIUM 9.0 7.8* 7.8*  MG  --  2.2  --   PHOS  --  3.0  --    Liver Function Tests:  Recent Labs Lab  06/18/15 1619 06/19/15 0637  AST 32 29  ALT 11* 10*  ALKPHOS 52 42  BILITOT 0.9 0.7  PROT 8.7* 7.5  ALBUMIN 3.4* 2.7*   No results for input(s): LIPASE, AMYLASE in the last 168 hours. No results for input(s): AMMONIA in the last 168 hours. CBC:  Recent Labs Lab 06/18/15 1619 06/19/15 0637 06/19/15 1840  WBC 10.3 5.8 4.4  NEUTROABS 8.5*  --   --   HGB 13.8 13.3 11.2*  HCT 40.5 37.7* 33.4*  MCV 94.2 93.8 94.4  PLT 167 141* 151   Cardiac Enzymes: No results for input(s): CKTOTAL, CKMB, CKMBINDEX, TROPONINI in the last 168 hours. BNP (last 3 results) No results for input(s): BNP in the last 8760 hours.  ProBNP (last 3 results) No results for input(s): PROBNP in the last 8760 hours.  CBG: No results for input(s): GLUCAP in the last 168 hours.  Recent Results (from the past 240 hour(s))  Urine culture     Status: None (Preliminary result)   Collection Time: 06/18/15  4:20 PM  Result Value Ref Range Status   Specimen Description URINE, RANDOM  Final   Special Requests Immunocompromised  Final   Culture >=100,000 COLONIES/mL ESCHERICHIA COLI  Final   Report Status PENDING  Incomplete  Culture, blood (Routine X 2) w Reflex to ID Panel     Status: None (Preliminary result)   Collection Time: 06/18/15  8:22 PM  Result Value Ref Range Status   Specimen Description BLOOD RIGHT ANTECUBITAL  Final   Special Requests BOTTLES DRAWN AEROBIC ONLY 10CC  Final   Culture NO GROWTH 2 DAYS  Final   Report Status PENDING  Incomplete  Culture, blood (Routine X 2) w Reflex to ID Panel     Status: None (Preliminary result)   Collection Time: 06/18/15  8:26 PM  Result Value Ref Range Status   Specimen Description BLOOD LEFT ANTECUBITAL  Final   Special Requests BOTTLES DRAWN AEROBIC ONLY 10CC  Final   Culture NO GROWTH 2 DAYS  Final   Report Status PENDING  Incomplete     Studies: Dg Chest 2 View  06/18/2015  CLINICAL DATA:  febrile, immune deficient. Chest pain, cough and fever X 2  days with nausea EXAM: CHEST  2 VIEW COMPARISON:  None. FINDINGS: Heart size is normal. Mild pectus deformity accentuates bronchovascular markings in the region of the right middle lobe. There are no focal consolidations or pleural effusions. No pulmonary edema. IMPRESSION: No evidence for acute cardiopulmonary abnormality. Electronically Signed   By: Nolon Nations M.D.   On: 06/18/2015 21:20   Ct Head Wo Contrast  06/19/2015  CLINICAL DATA:  Headaches and photophobia, worse when standing. History of HIV. 30 pound weight loss. EXAM: CT HEAD WITHOUT CONTRAST TECHNIQUE: Contiguous axial images were obtained from the base of the skull through the vertex without intravenous contrast. COMPARISON:  None. FINDINGS: Ventricles and sulci appear symmetrical. No mass effect or midline shift. No abnormal extra-axial fluid collections. Gray-white matter junctions are distinct. Basal cisterns are not effaced. No evidence of acute intracranial hemorrhage. No depressed skull fractures. Opacification of bilateral ethmoid air cells. Mastoid air cells are not opacified. IMPRESSION: No acute intracranial abnormalities. Opacification of scattered bilateral ethmoid air cells. Electronically Signed   By: Lucienne Capers M.D.   On: 06/19/2015 00:03    Scheduled Meds: . cefTRIAXone (ROCEPHIN)  IV  1 g Intravenous Q24H   Continuous Infusions: . sodium chloride 150 mL/hr at 06/20/15 [id number]    Active Problems:   AIDS (acquired immune deficiency syndrome) ([place])   UTI (lower urinary tract infection)   Complicated UTI (urinary tract infection)    Time spent: 25 min    [facility]  [facility] Pager [phone]*. If 7PM-7AM, please contact night-coverage at [email or web address], password [facility] 06/20/2015, 3:58 PM  LOS: 2 days

## 2015-06-20 NOTE — Progress Notes (Signed)
[facility] for Infectious Disease   Reason for visit: Follow up on HIV/ UTI  Interval History: has continued fever with Tmax to 103.  Culture with E coli.  Poor appetite.  CD4 is 20, viral load 743,000.  No headache.    Physical Exam: Constitutional:  Filed Vitals:   06/20/15 0543 06/20/15 1012  BP:  103/47  Pulse:  83  Temp: 102.6 F (39.2 C) 98 F (36.7 C)  Resp:  18   patient appears in NAD Eyes: anicteric HENT: no thrush Respiratory: Normal respiratory effort; CTA B Cardiovascular: RRR GI: soft, nt, nd  Review of Systems: Constitutional: negative for chills Gastrointestinal: positive for constipation, negative for nausea and diarrhea Integument/breast: negative for rash  Lab Results  Component Value Date   WBC 4.4 06/19/2015   HGB 11.2* 06/19/2015   HCT 33.4* 06/19/2015   MCV 94.4 06/19/2015   PLT 151 06/19/2015    Lab Results  Component Value Date   CREATININE 1.01 06/20/2015   BUN 5* 06/20/2015   NA 135 06/20/2015   K 3.6 06/20/2015   CL 103 06/20/2015   CO2 25 06/20/2015    Lab Results  Component Value Date   ALT 10* 06/19/2015   AST 29 06/19/2015   ALKPHOS 42 06/19/2015     Microbiology: Recent Results (from the past 240 hour(s))  Urine culture     Status: None (Preliminary result)   Collection Time: 06/18/15  4:20 PM  Result Value Ref Range Status   Specimen Description URINE, RANDOM  Final   Special Requests Immunocompromised  Final   Culture >=100,000 COLONIES/mL ESCHERICHIA COLI  Final   Report Status PENDING  Incomplete  Culture, blood (Routine X 2) w Reflex to ID Panel     Status: None (Preliminary result)   Collection Time: 06/18/15  8:22 PM  Result Value Ref Range Status   Specimen Description BLOOD RIGHT ANTECUBITAL  Final   Special Requests BOTTLES DRAWN AEROBIC ONLY 10CC  Final   Culture NO GROWTH 2 DAYS  Final   Report Status PENDING  Incomplete  Culture, blood (Routine X 2) w Reflex to ID Panel     Status: None  (Preliminary result)   Collection Time: 06/18/15  8:26 PM  Result Value Ref Range Status   Specimen Description BLOOD LEFT ANTECUBITAL  Final   Special Requests BOTTLES DRAWN AEROBIC ONLY 10CC  Final   Culture NO GROWTH 2 DAYS  Final   Report Status PENDING  Incomplete    Impression/Plan:  1. Pyelonephritis - has high fever, urine symptoms and growth on culture c/w pyelonephritis.  Continue with ceftriaxone.  Can change to oral therapy tomorrow if sensitive and treat for 7 more days.   2. HIV/AIDS - poor compliance with medications previously.  Now interested in treatment and wants to have a stable provider rather than a new one each time (different fellow at [facility]).  Will commit to treatment and I discussed with him that he very well may have fatigue, diarrhea, headache when initiating treatment since his immune system is so low and needs to continue with ARVs once he starts.   He also was seen by our case manager RN Ambre here in the hospital.   Will check genotypes, HLA Hep C negative Has appt scheduled with [name] next week

## 2015-06-21 LAB — URINE CULTURE: Culture: >=100000

## 2015-06-21 MED ORDER — HYDROCODONE-ACETAMINOPHEN 5-325 MG PO TABS: 1.0000 | ORAL_TABLET | ORAL | Status: DC | PRN

## 2015-06-21 MED ORDER — CEPHALEXIN 500 MG PO CAPS
500.0000 mg | ORAL_CAPSULE | Freq: Two times a day (BID) | ORAL | Status: DC
Start: 2015-06-21 — End: 2015-06-26

## 2015-06-21 NOTE — Care Management Note (Signed)
Case Management Note  Patient Details  Name: Brandon Hernandez MRN: [id number] Date of Birth: 1981-01-07  Subjective/Objective:                    Action/Plan:   Expected Discharge Date:                  Expected Discharge Plan:  Home/Self Care  In-House Referral:     Discharge planning Services  [facility], Medication Assistance  Post Acute Care Choice:    Choice offered to:     DME Arranged:    DME Agency:     HH Arranged:    HH Agency:     Status of Service:  Completed, signed off  Medicare Important Message Given:    Date Medicare IM Given:    Medicare IM give by:    Date Additional Medicare IM Given:    Additional Medicare Important Message give by:     If discussed at [place] of Stay Meetings, dates discussed:    Additional Comments:  Marilu Favre, RN 06/21/2015, 11:15 AM

## 2015-06-21 NOTE — Discharge Summary (Signed)
Physician Discharge Summary  Brandon Hernandez [id number] DOB: Jul 01, 1980 DOA: 06/18/2015  PCP: No PCP Per Patient  Admit date: 06/18/2015 Discharge date: 06/21/2015  Time spent: *25 minutes  Recommendations for Outpatient Follow-up:  1. Follow-up infectious disease in 2 weeks   Discharge Diagnoses:  Active Problems:   AIDS (acquired immune deficiency syndrome) (HCC)   UTI (lower urinary tract infection)   Complicated UTI (urinary tract infection)   Discharge Condition: Stable  Diet recommendation: Regular diet  Filed Weights   06/18/15 2311  Weight: 65.318 kg (144 lb)    History of present illness:  Presented with abdominal pain and fevers x 1 day. Report headache for the past week. No neck pain, mild photophobia. Headache worse when trying to stand up. He is going to beauty school. He has known history of HIV diagnosed 8 years ago not on any medications patient does state he is not complying due to side effects. Noticed burning with urination.   Hospital Course:  1. AIDS- patient's CD4 count is 20, viral load 743,000. Patient now agrees to take antiretroviral therapy. Will follow up with ID as outpatient 2. UTI- urine culture is growing Escherichia coli patient was started on  IV Rocephin. Urine culture shows Escherichia coli sensitive to Cefazolin, will discharge the patient on Keflex 500 by mouth twice a day for 7 more days as per ID recommendation.  Procedures:  None  Consultations:  Infectious disease  Discharge Exam: Filed Vitals:   06/20/15 2300 06/21/15 0610  BP: 115/67 96/49  Pulse: 85 84  Temp: 100.2 F (37.9 C) 98.9 F (37.2 C)  Resp: 18 16    General: Appears in no acute distress Cardiovascular: S1-S2 regular Respiratory: Clear to auscultation bilaterally  Discharge Instructions   Discharge Instructions    Diet - low sodium heart healthy    Complete by:  As directed      Increase activity slowly    Complete by:  As directed            Current Discharge Medication List    START taking these medications   Details  cephALEXin (KEFLEX) 500 MG capsule Take 1 capsule (500 mg total) by mouth 2 (two) times daily. Qty: 14 capsule, Refills: 0    HYDROcodone-acetaminophen (NORCO/VICODIN) 5-325 MG tablet Take 1-2 tablets by mouth every 4 (four) hours as needed for moderate pain. Qty: 30 tablet, Refills: 0       No Known Allergies    The results of significant diagnostics from this hospitalization (including imaging, microbiology, ancillary and laboratory) are listed below for reference.    Significant Diagnostic Studies: Dg Chest 2 View  06/18/2015  CLINICAL DATA:  febrile, immune deficient. Chest pain, cough and fever X 2 days with nausea EXAM: CHEST  2 VIEW COMPARISON:  None. FINDINGS: Heart size is normal. Mild pectus deformity accentuates bronchovascular markings in the region of the right middle lobe. There are no focal consolidations or pleural effusions. No pulmonary edema. IMPRESSION: No evidence for acute cardiopulmonary abnormality. Electronically Signed   By: Nolon Nations M.D.   On: 06/18/2015 21:20   Ct Head Wo Contrast  06/19/2015  CLINICAL DATA:  Headaches and photophobia, worse when standing. History of HIV. 30 pound weight loss. EXAM: CT HEAD WITHOUT CONTRAST TECHNIQUE: Contiguous axial images were obtained from the base of the skull through the vertex without intravenous contrast. COMPARISON:  None. FINDINGS: Ventricles and sulci appear symmetrical. No mass effect or midline shift. No abnormal extra-axial fluid collections. Gray-white matter  junctions are distinct. Basal cisterns are not effaced. No evidence of acute intracranial hemorrhage. No depressed skull fractures. Opacification of bilateral ethmoid air cells. Mastoid air cells are not opacified. IMPRESSION: No acute intracranial abnormalities. Opacification of scattered bilateral ethmoid air cells. Electronically Signed   By: Lucienne Capers M.D.   On:  06/19/2015 00:03    Microbiology: Recent Results (from the past 240 hour(s))  Urine culture     Status: None   Collection Time: 06/18/15  4:20 PM  Result Value Ref Range Status   Specimen Description URINE, RANDOM  Final   Special Requests Immunocompromised  Final   Culture >=100,000 COLONIES/mL ESCHERICHIA COLI  Final   Report Status 06/21/2015 FINAL  Final   Organism ID, Bacteria ESCHERICHIA COLI  Final      Susceptibility   Escherichia coli - MIC*    AMPICILLIN >=32 RESISTANT Resistant     CEFAZOLIN <=4 SENSITIVE Sensitive     CEFTRIAXONE <=1 SENSITIVE Sensitive     CIPROFLOXACIN <=0.25 SENSITIVE Sensitive     GENTAMICIN <=1 SENSITIVE Sensitive     IMIPENEM <=0.25 SENSITIVE Sensitive     NITROFURANTOIN <=16 SENSITIVE Sensitive     TRIMETH/SULFA >=320 RESISTANT Resistant     AMPICILLIN/SULBACTAM 16 INTERMEDIATE Intermediate     PIP/TAZO <=4 SENSITIVE Sensitive     * >=100,000 COLONIES/mL ESCHERICHIA COLI  Culture, blood (Routine X 2) w Reflex to ID Panel     Status: None (Preliminary result)   Collection Time: 06/18/15  8:22 PM  Result Value Ref Range Status   Specimen Description BLOOD RIGHT ANTECUBITAL  Final   Special Requests BOTTLES DRAWN AEROBIC ONLY 10CC  Final   Culture NO GROWTH 2 DAYS  Final   Report Status PENDING  Incomplete  Culture, blood (Routine X 2) w Reflex to ID Panel     Status: None (Preliminary result)   Collection Time: 06/18/15  8:26 PM  Result Value Ref Range Status   Specimen Description BLOOD LEFT ANTECUBITAL  Final   Special Requests BOTTLES DRAWN AEROBIC ONLY 10CC  Final   Culture NO GROWTH 2 DAYS  Final   Report Status PENDING  Incomplete     Labs: Basic Metabolic Panel:  Recent Labs Lab 06/18/15 1619 06/19/15 0637 06/20/15 0348  NA 139 143 135  K 3.8 3.7 3.6  CL 99* 109 103  CO2 26 27 25   GLUCOSE 103* 83 92  BUN 11 11 5*  CREATININE 1.17 0.92 1.01  CALCIUM 9.0 7.8* 7.8*  MG  --  2.2  --   PHOS  --  3.0  --    Liver Function  Tests:  Recent Labs Lab 06/18/15 1619 06/19/15 0637  AST 32 29  ALT 11* 10*  ALKPHOS 52 42  BILITOT 0.9 0.7  PROT 8.7* 7.5  ALBUMIN 3.4* 2.7*   No results for input(s): LIPASE, AMYLASE in the last 168 hours. No results for input(s): AMMONIA in the last 168 hours. CBC:  Recent Labs Lab 06/18/15 1619 06/19/15 0637 06/19/15 1840  WBC 10.3 5.8 4.4  NEUTROABS 8.5*  --   --   HGB 13.8 13.3 11.2*  HCT 40.5 37.7* 33.4*  MCV 94.2 93.8 94.4  PLT 167 141* 151       Signed:  Khristopher Kapaun S MD.  [facility] 06/21/2015, 10:35 AM

## 2015-06-21 NOTE — Progress Notes (Signed)
Arnette Felts to be D/C'd home  per MD order. Discussed with the patient and all questions fully answered.  VSS, Skin clean, dry and intact without evidence of skin break down, no evidence of skin tears noted.  IV catheter discontinued intact. Site without signs and symptoms of complications. Dressing and pressure applied.  An After Visit Summary was printed and given to the patient. Patient received prescriptions and match letter.  D/c education completed with patient/family including follow up instructions, medication list, d/c activities limitations if indicated, with other d/c instructions as indicated by MD - patient able to verbalize understanding, all questions fully answered.   Patient instructed to return to ED, call 911, or call MD for any changes in condition.   Patient to be escorted via [place], and D/C home via private auto.

## 2015-06-23 LAB — CULTURE, BLOOD (ROUTINE X 2)
CULTURE: NO GROWTH
Culture: NO GROWTH

## 2015-06-26 ENCOUNTER — Encounter: Payer: Self-pay | Admitting: Infectious Disease

## 2015-06-26 ENCOUNTER — Ambulatory Visit (INDEPENDENT_AMBULATORY_CARE_PROVIDER_SITE_OTHER): Payer: Self-pay | Admitting: Infectious Disease

## 2015-06-26 ENCOUNTER — Encounter: Payer: Self-pay | Admitting: *Deleted

## 2015-06-26 VITALS — BP 112/76 | HR 88 | Temp 100.1°F | Ht 71.0 in | Wt 150.0 lb

## 2015-06-26 DIAGNOSIS — B59 Pneumocystosis: Secondary | ICD-10-CM

## 2015-06-26 DIAGNOSIS — N390 Urinary tract infection, site not specified: Secondary | ICD-10-CM

## 2015-06-26 DIAGNOSIS — B20 Human immunodeficiency virus [HIV] disease: Secondary | ICD-10-CM

## 2015-06-26 DIAGNOSIS — R509 Fever, unspecified: Secondary | ICD-10-CM

## 2015-06-26 DIAGNOSIS — K5900 Constipation, unspecified: Secondary | ICD-10-CM

## 2015-06-26 HISTORY — DX: Pneumocystosis: B59

## 2015-06-26 HISTORY — DX: Constipation, unspecified: K59.00

## 2015-06-26 HISTORY — DX: Human immunodeficiency virus (HIV) disease: B20

## 2015-06-26 HISTORY — DX: Fever, unspecified: R50.9

## 2015-06-26 MED ORDER — SULFAMETHOXAZOLE-TRIMETHOPRIM 800-160 MG PO TABS: 1.0000 | ORAL_TABLET | Freq: Two times a day (BID) | ORAL | Status: DC

## 2015-06-26 MED ORDER — DOLUTEGRAVIR SODIUM 50 MG PO TABS: 50.0000 mg | ORAL_TABLET | Freq: Every day | ORAL | Status: DC

## 2015-06-26 MED ORDER — EMTRICITAB-RILPIVIR-TENOFOV AF 200-25-25 MG PO TABS: 1.0000 | ORAL_TABLET | Freq: Every day | ORAL | Status: DC

## 2015-06-26 NOTE — Progress Notes (Signed)
Chief complaint: constipation, light headedness, dizziness   Subjective:    Patient ID: Brandon Hernandez, male    DOB: 07/11/1980, 35 y.o.   MRN: [id number]  HPI  35 year old [place] American male with HIV/AIDS who was initially treated for his HIV by our group in [place]--though I cannot locate his records on [place] his name was VERY familiar to me and I am sure this is correct. He describes having been on Atripla then switched to various other regimens. He had been in care at [facility] and most recently seen by ID there in mid January and was not wanting to restart ARV's. Per their notes he had been on STRIBILD Genvoya and before that Prezista/Norvir and Truvada but never with proper virological success. I could not locate genotypes from [facility]. He has one pending from our hospital when he was hospitalized from 06/19/15 through 06/21/15 with fevers, and malaise and had been rx for UTI.   Viral load was well above 700K and CD4 abysmally low.  He indicates to me today that he would be willing to start ARV. He perseverated on STR but I told him that was not likely a good idea in his case. After extensive discussion we decided to go with Tivicay and ODEFSEY with fool and avoiding PPI and H2 blockers.  He is complaining. Of lightheadedness , dizziness at times, as well as constipation and fevers.  Past Medical History  Diagnosis Date  . HIV disease ([place])   . AIDS ([place]) 06/26/2015  . PCP (pneumocystis carinii pneumonia) ([place]) 06/26/2015  . Constipation 06/26/2015  . FUO (fever of unknown origin) 06/26/2015    No past surgical history on file.  Family History  Problem Relation Age of Onset  . Cancer Mother   . Cancer Father   . Diabetes Sister       Social History   Social History  . Marital Status: Single    Spouse Name: N/A  . Number of Children: N/A  . Years of Education: N/A   Social History Main Topics  . Smoking status: Former Research scientist (life sciences)  . Smokeless tobacco: None  .  Alcohol Use: No  . Drug Use: No  . Sexual Activity: Not Asked   Other Topics Concern  . None   Social History Narrative    No Known Allergies   Current outpatient prescriptions:  .  dolutegravir (TIVICAY) 50 MG tablet, Take 1 tablet (50 mg total) by mouth daily., Disp: 30 tablet, Rfl: 11 .  emtricitabine-rilpivir-tenofovir AF (ODEFSEY) 200-25-25 MG TABS tablet, Take 1 tablet by mouth daily with breakfast., Disp: 30 tablet, Rfl: 11 .  HYDROcodone-acetaminophen (NORCO/VICODIN) 5-325 MG tablet, Take 1-2 tablets by mouth every 4 (four) hours as needed for moderate pain. (Patient not taking: Reported on 06/26/2015), Disp: 30 tablet, Rfl: 0 .  sulfamethoxazole-trimethoprim (BACTRIM DS,SEPTRA DS) 800-160 MG tablet, Take 1 tablet by mouth 2 (two) times daily., Disp: 30 tablet, Rfl: 11    Review of Systems  Constitutional: Positive for fever, chills, diaphoresis, activity change, fatigue and unexpected weight change. Negative for appetite change.  HENT: Negative for congestion, rhinorrhea, sinus pressure, sneezing, sore throat and trouble swallowing.   Eyes: Negative for photophobia and visual disturbance.  Respiratory: Positive for shortness of breath. Negative for cough, chest tightness, wheezing and stridor.   Cardiovascular: Negative for chest pain, palpitations and leg swelling.  Gastrointestinal: Positive for constipation. Negative for nausea, vomiting, abdominal pain, diarrhea, blood in stool, abdominal distention and anal bleeding.  Genitourinary: Negative for dysuria, hematuria, flank pain and difficulty urinating.  Musculoskeletal: Negative for myalgias, back pain, joint swelling, arthralgias and gait problem.  Skin: Negative for color change, pallor, rash and wound.  Neurological: Positive for dizziness, weakness and light-headedness. Negative for tremors.  Hematological: Negative for adenopathy. Does not bruise/bleed easily.  Psychiatric/Behavioral: Negative for behavioral  problems, confusion, sleep disturbance, dysphoric mood, decreased concentration and agitation.       Objective:   Physical Exam  Constitutional: He is oriented to person, place, and time.  HENT:  Head: Normocephalic and atraumatic.  Eyes: Conjunctivae and EOM are normal.  Neck: Normal range of motion. Neck supple.  Cardiovascular: Normal rate, regular rhythm and normal heart sounds.  Exam reveals no gallop and no friction rub.   No murmur heard. Pulmonary/Chest: Effort normal. No respiratory distress. He has no wheezes. He has no rales. He exhibits no tenderness.  Abdominal: Soft. He exhibits no distension.  Musculoskeletal: Normal range of motion. He exhibits no edema or tenderness.  Neurological: He is alert and oriented to person, place, and time.  Skin: Skin is warm and dry.  Psychiatric: He has a normal mood and affect. His behavior is normal. Judgment and thought content normal.  Nursing note and vitals reviewed.         Assessment & Plan:   HIV/AIDS:   We will start Tivicay and ODEFSEY. I will get records from [place] re HIV genotypes and also from [place] Bactrim DS daily he will likely need azithromycin for M avium prophylaxis as I am skeptical he will be able to reconstitute his immune system rapidly  FUO with constipation, malaise: high risk for having M avium and will check AFB blood cultures  Lightheadedness dizziness: could be due to his HIV itself. He could have Adrenal Insufficiency  I spent greater than 40 minutes with the patient including greater than 50% of time in face to face counsel of the patient re his HIV/AIDS, fevers, constipation. Light headedness dizziness and in coordination of their care.

## 2015-06-27 ENCOUNTER — Telehealth: Payer: Self-pay | Admitting: *Deleted

## 2015-06-27 LAB — HLA B*5701: HLA B 5701: NEGATIVE

## 2015-06-27 NOTE — Telephone Encounter (Signed)
Referred to [facility]. Landis Gandy, RN

## 2015-06-27 NOTE — Progress Notes (Signed)
Patient ID: Brandon Hernandez, male   DOB: 07/23/1980, 35 y.o.   MRN: [id number] RN conducted a hospital visit with the patient during this current admit to [facility]. I introduced myself by name and explained the reason for my visit is to add additional support at this time with hopes of continuing to see him once he is discharged. I explained to Mr. Brandon Hernandez that I am a extension of the [facility] and can offer him assistance with removing any barriers he may have to taking his medications. Patient stated he has been going up and down with taking his medications. Mr. Brandon Hernandez stated he was taking Herbal remedies to manage his HIV but could no longer afford the medication. RN stated it is a great thing that [employer] covers the cost of is medications and they are also proven to be effective with suppressing the HIV virus. Patient agreed stating at times he does well but when he stopped taking his medications his partner did the same.  Mr. Brandon Hernandez spoke about his most recent relationship and the struggles him and his partner went through. Currently he plans to move in with his sister in [place] until his housing voucher can be transferred to [employer]. Patient states his family is aware of his status and are supportive. Currently he does not see the need for my services. Mr. Brandon Hernandez was given my contact information and asked to please keep in touch with me. RN advised the patient that he I may be able to be of service to him in the future. Mr. Brandon Hernandez thanked me for my time and for listening to him

## 2015-06-29 LAB — REFLEX TO GENOSURE(R) MG
HIV GENOSURE(R) MG PDF: UNDETERMINED
HIV GenoSure(R): UNDETERMINED
HIV GenoSure: UNDETERMINED

## 2015-06-29 LAB — HIV-1 RNA ULTRAQUANT REFLEX TO GENTYP+
HIV-1 RNA BY PCR: 532000 {copies}/mL
HIV-1 RNA QUANT, LOG: 5.726 {Log_copies}/mL

## 2015-07-01 LAB — HIV-1 INTEGRASE GENOTYPE
Date Viral Load Collected: NO GROWTH
Value last viral load: NO GROWTH

## 2015-07-03 ENCOUNTER — Telehealth: Payer: Self-pay | Admitting: Infectious Disease

## 2015-07-03 NOTE — Telephone Encounter (Signed)
Need to get records from [place] and from [place]

## 2015-07-09 ENCOUNTER — Other Ambulatory Visit: Payer: Self-pay | Admitting: *Deleted

## 2015-07-09 ENCOUNTER — Telehealth: Payer: Self-pay | Admitting: *Deleted

## 2015-07-09 DIAGNOSIS — R350 Frequency of micturition: Secondary | ICD-10-CM

## 2015-07-09 NOTE — Telephone Encounter (Signed)
Patient notified and will come for urine test 07/10/15 in am. Labs added.

## 2015-07-09 NOTE — Telephone Encounter (Signed)
Patient called to advise he is having painful urination, frequency and pressure when he does not have to urinate. He advised no discharge but feels like it did last time he had a recent UTI.

## 2015-07-09 NOTE — Telephone Encounter (Signed)
Please check UA, UCx, urine gc/chlamydia

## 2015-07-10 ENCOUNTER — Other Ambulatory Visit: Payer: Self-pay

## 2015-07-10 ENCOUNTER — Other Ambulatory Visit: Payer: Self-pay | Admitting: *Deleted

## 2015-07-10 DIAGNOSIS — B20 Human immunodeficiency virus [HIV] disease: Secondary | ICD-10-CM

## 2015-07-10 DIAGNOSIS — Z113 Encounter for screening for infections with a predominantly sexual mode of transmission: Secondary | ICD-10-CM

## 2015-07-10 DIAGNOSIS — R350 Frequency of micturition: Secondary | ICD-10-CM

## 2015-07-11 ENCOUNTER — Encounter: Payer: Self-pay | Admitting: *Deleted

## 2015-07-11 LAB — URINALYSIS, MICROSCOPIC ONLY
CASTS: NONE SEEN [LPF]
CRYSTALS: NONE SEEN [HPF]
SQUAMOUS EPITHELIAL / LPF: NONE SEEN [HPF] (ref <=5)
WBC, UA: >60 WBC/HPF — AB (ref <=5)
YEAST: NONE SEEN [HPF]

## 2015-07-11 LAB — URINALYSIS, ROUTINE W REFLEX MICROSCOPIC
BILIRUBIN URINE: NEGATIVE
Glucose, UA: NEGATIVE
Hgb urine dipstick: NEGATIVE
KETONES UR: NEGATIVE
NITRITE: POSITIVE — AB
Protein, ur: NEGATIVE
Specific Gravity, Urine: 1.014 (ref 1.001–1.035)
pH: 7 (ref 5.0–8.0)

## 2015-07-11 LAB — URINE CYTOLOGY ANCILLARY ONLY
CHLAMYDIA, DNA PROBE: NEGATIVE
Neisseria Gonorrhea: NEGATIVE

## 2015-07-11 NOTE — Telephone Encounter (Signed)
Awaiting Ucx prior to anbx unless pt is clinically unwell Will ask Ms Brandon Hernandez to inform if this is the case

## 2015-07-11 NOTE — Telephone Encounter (Signed)
Called patient and advised him we are waiting on the Culture to make a decision. He advised he is uncomfortable but can wait until tomorrow for the culture to result. Advised will give him a call asap.

## 2015-07-11 NOTE — Telephone Encounter (Signed)
Error

## 2015-07-11 NOTE — Telephone Encounter (Signed)
Patient called to get the results of his recent urine studies. Advised still waiting on the urine culture but will ask the doctor to look at them and will give him a call back asap.

## 2015-07-12 ENCOUNTER — Other Ambulatory Visit: Payer: Self-pay | Admitting: *Deleted

## 2015-07-12 ENCOUNTER — Other Ambulatory Visit: Payer: Self-pay | Admitting: Infectious Diseases

## 2015-07-12 DIAGNOSIS — N390 Urinary tract infection, site not specified: Secondary | ICD-10-CM

## 2015-07-12 MED ORDER — LEVOFLOXACIN 500 MG PO TABS: 500.0000 mg | ORAL_TABLET | Freq: Every day | ORAL | Status: AC

## 2015-07-12 MED ORDER — LEVOFLOXACIN 500 MG PO TABS: 500.0000 mg | ORAL_TABLET | Freq: Every day | ORAL | Status: DC

## 2015-07-13 LAB — URINE CULTURE: Colony Count: >=100000

## 2015-07-16 NOTE — Telephone Encounter (Signed)
Corresponded with Benjamine Mola. Per Dr. Loletta Parish.They did not do genotypes at [facility].

## 2015-07-30 ENCOUNTER — Other Ambulatory Visit: Payer: Medicaid Other

## 2015-07-30 ENCOUNTER — Other Ambulatory Visit (HOSPITAL_COMMUNITY)
Admission: RE | Admit: 2015-07-30 | Discharge: 2015-07-30 | Disposition: A | Discharge status: Home / Self Care | Payer: Medicaid Other | Source: Ambulatory Visit | Attending: Infectious Disease | Admitting: Infectious Disease

## 2015-07-30 DIAGNOSIS — Z79899 Other long term (current) drug therapy: Secondary | ICD-10-CM

## 2015-07-30 DIAGNOSIS — Z113 Encounter for screening for infections with a predominantly sexual mode of transmission: Secondary | ICD-10-CM | POA: Insufficient documentation

## 2015-07-30 DIAGNOSIS — B20 Human immunodeficiency virus [HIV] disease: Secondary | ICD-10-CM

## 2015-07-30 LAB — COMPLETE METABOLIC PANEL WITH GFR
ALT: 93 U/L — ABNORMAL HIGH (ref 9–46)
AST: 68 U/L — AB (ref 10–40)
Albumin: 3.4 g/dL — ABNORMAL LOW (ref 3.6–5.1)
Alkaline Phosphatase: 88 U/L (ref 40–115)
BUN: 7 mg/dL (ref 7–25)
CALCIUM: 9.2 mg/dL (ref 8.6–10.3)
CHLORIDE: 101 mmol/L (ref 98–110)
CO2: 27 mmol/L (ref 20–31)
Creat: 0.92 mg/dL (ref 0.60–1.35)
GFR, Est African American: >89 mL/min (ref >=60)
GFR, Est Non African American: >89 mL/min (ref >=60)
Glucose, Bld: 97 mg/dL (ref 65–99)
POTASSIUM: 3.9 mmol/L (ref 3.5–5.3)
SODIUM: 138 mmol/L (ref 135–146)
Total Bilirubin: 0.5 mg/dL (ref 0.2–1.2)
Total Protein: 8.5 g/dL — ABNORMAL HIGH (ref 6.1–8.1)

## 2015-07-30 LAB — LIPID PANEL
CHOL/HDL RATIO: 3.7 ratio (ref <=5.0)
Cholesterol: 170 mg/dL (ref 125–200)
HDL: 46 mg/dL (ref >=40)
LDL Cholesterol: 105 mg/dL (ref <130)
Triglycerides: 97 mg/dL (ref <150)
VLDL: 19 mg/dL (ref <30)

## 2015-07-30 LAB — CBC WITH DIFFERENTIAL/PLATELET
Basophils Absolute: 0.1 10*3/uL (ref 0.0–0.1)
Basophils Relative: 1 % (ref 0–1)
Eosinophils Absolute: 1.1 10*3/uL — ABNORMAL HIGH (ref 0.0–0.7)
Eosinophils Relative: 18 % — ABNORMAL HIGH (ref 0–5)
HCT: 38.8 % — ABNORMAL LOW (ref 39.0–52.0)
HEMOGLOBIN: 13 g/dL (ref 13.0–17.0)
LYMPHS ABS: 2.5 10*3/uL (ref 0.7–4.0)
LYMPHS PCT: 42 % (ref 12–46)
MCH: 32.2 pg (ref 26.0–34.0)
MCHC: 33.5 g/dL (ref 30.0–36.0)
MCV: 96 fL (ref 78.0–100.0)
MONOS PCT: 9 % (ref 3–12)
MPV: 9.5 fL (ref 8.6–12.4)
Monocytes Absolute: 0.5 10*3/uL (ref 0.1–1.0)
NEUTROS ABS: 1.8 10*3/uL (ref 1.7–7.7)
NEUTROS PCT: 30 % — AB (ref 43–77)
Platelets: 267 10*3/uL (ref 150–400)
RBC: 4.04 MIL/uL — ABNORMAL LOW (ref 4.22–5.81)
RDW: 16.2 % — ABNORMAL HIGH (ref 11.5–15.5)
WBC: 5.9 10*3/uL (ref 4.0–10.5)

## 2015-07-31 LAB — T-HELPER CELL (CD4) - (RCID CLINIC ONLY)
CD4 T CELL HELPER: 7 % — AB (ref 33–55)
CD4 T Cell Abs: 200 /uL — ABNORMAL LOW (ref 400–2700)

## 2015-07-31 LAB — RPR

## 2015-07-31 LAB — URINE CYTOLOGY ANCILLARY ONLY
Chlamydia: NEGATIVE
Neisseria Gonorrhea: NEGATIVE

## 2015-08-02 LAB — HIV RNA, RTPCR W/R GT (RTI, PI,INT)
HIV 1 RNA QUANT: 289 {copies}/mL — AB
HIV-1 RNA Quant, Log: 2.46 Log copies/mL — ABNORMAL HIGH

## 2015-08-09 LAB — AFB CULTURE, BLOOD

## 2015-08-13 ENCOUNTER — Ambulatory Visit (INDEPENDENT_AMBULATORY_CARE_PROVIDER_SITE_OTHER): Payer: Medicaid Other | Admitting: Infectious Disease

## 2015-08-13 ENCOUNTER — Ambulatory Visit: Payer: Medicaid Other | Admitting: *Deleted

## 2015-08-13 ENCOUNTER — Encounter: Payer: Self-pay | Admitting: Infectious Disease

## 2015-08-13 VITALS — BP 136/81 | HR 108 | Temp 98.6°F | Ht 72.0 in | Wt 159.0 lb

## 2015-08-13 DIAGNOSIS — D893 Immune reconstitution syndrome: Secondary | ICD-10-CM | POA: Diagnosis not present

## 2015-08-13 DIAGNOSIS — R21 Rash and other nonspecific skin eruption: Secondary | ICD-10-CM | POA: Insufficient documentation

## 2015-08-13 DIAGNOSIS — B59 Pneumocystosis: Secondary | ICD-10-CM | POA: Diagnosis not present

## 2015-08-13 DIAGNOSIS — Z91199 Patient's noncompliance with other medical treatment and regimen due to unspecified reason: Secondary | ICD-10-CM | POA: Insufficient documentation

## 2015-08-13 DIAGNOSIS — R509 Fever, unspecified: Secondary | ICD-10-CM | POA: Diagnosis not present

## 2015-08-13 DIAGNOSIS — Z9119 Patient's noncompliance with other medical treatment and regimen: Secondary | ICD-10-CM | POA: Diagnosis not present

## 2015-08-13 DIAGNOSIS — B20 Human immunodeficiency virus [HIV] disease: Secondary | ICD-10-CM

## 2015-08-13 DIAGNOSIS — R7989 Other specified abnormal findings of blood chemistry: Secondary | ICD-10-CM | POA: Diagnosis not present

## 2015-08-13 DIAGNOSIS — R945 Abnormal results of liver function studies: Secondary | ICD-10-CM

## 2015-08-13 HISTORY — DX: Rash and other nonspecific skin eruption: R21

## 2015-08-13 HISTORY — DX: Immune reconstitution syndrome: D89.3

## 2015-08-13 HISTORY — DX: Other specified abnormal findings of blood chemistry: R79.89

## 2015-08-13 NOTE — Progress Notes (Signed)
Patient ID: Brandon Hernandez, male   DOB: Jun 28, 1980, 35 y.o.   MRN: [id number] Met briefly with him and he scheduled an appointment with the front desk.  He shared that he spent a whole day trying to be seen at an agency in [facility] last week and wasn't seen.   Curley Spice, LCSW

## 2015-08-13 NOTE — Progress Notes (Signed)
Chief complaint: rash on chest and back and concerns about toxicity of ARV's  Subjective:    Patient ID: Brandon Hernandez, male    DOB: 06-19-1980, 35 y.o.   MRN: [id number]  HPI   35 year old [place] American male with HIV/AIDS who was initially treated for his HIV by our group in [place]. He describes having been on Atripla then switched to various other regimens. He had been in care at [facility] and most recently seen by ID there in mid January and was not wanting to restart ARV's. Per their notes he had been on STRIBILD Genvoya and before that Prezista/Norvir and Truvada but never with proper virological success. I could not locate genotypes from [facility]. He has one pending from our hospital when he was hospitalized from 06/19/15 through 06/21/15 with fevers, and malaise and had been rx for UTI.--> this was ultimately found to be without sufficent quantity???  Viral load was well above 700K and CD4 abysmally low.  He indicated to me at last visit that he would be willing to start ARV. He perseverated on STR but I told him that was not likely a good idea in his case. After extensive discussion we decided to go with Tivicay and ODEFSEY with fool and avoiding PPI and H2 blockers.  He actually has done dramatically well with vial load plummetting and CD4 now at 200.  He is not taking PCP prophylaxis because the medicine was in a generic description rather than brand name.  He has suffered from rash on chest and back likely due to IRIS. He does endorse depressive ssx and is willing to meet with a counselor.  I was a bit disturbed by Brandon Hernandez's perseveration about  Need to undergo a body "cleanse" and need to do this to "rid my body of the HIV medicines to get ready for the next time. He told me that he had found an  "herbal medication that could completely suppress his viral load" but could not find it recently.  He also perseverated about risks for liver and kidney toxicity. I informed him  of fact that his current regimen is with essentially ZERO kidney toxicty and that potential hepatotoxicity was negligible.   I also re-iterated results of START and SMART trials and about consequences to his health of stopping medicines and that he would need to commit to lifelong therapy with ARV to have chance of normal life expectancy and have a much more normal quality of life.  Past Medical History  Diagnosis Date  . HIV disease ([place])   . AIDS ([place]) 06/26/2015  . PCP (pneumocystis carinii pneumonia) ([place]) 06/26/2015  . Constipation 06/26/2015  . FUO (fever of unknown origin) 06/26/2015  . IRIS (immune reconstitution inflammatory syndrome) ([place]) 08/13/2015  . Rash and nonspecific skin eruption 08/13/2015    No past surgical history on file.  Family History  Problem Relation Age of Onset  . Cancer Mother   . Cancer Father   . Diabetes Sister       Social History   Social History  . Marital Status: Single    Spouse Name: N/A  . Number of Children: N/A  . Years of Education: N/A   Social History Main Topics  . Smoking status: Current Every Day Smoker -- 0.33 packs/day    Types: Cigarettes    Start date: 05/27/1999  . Smokeless tobacco: Never Used  . Alcohol Use: 0.0 oz/week    0 Standard drinks or equivalent per week  Comment: rarely  . Drug Use: No  . Sexual Activity: Not Currently   Other Topics Concern  . None   Social History Narrative    No Known Allergies   Current outpatient prescriptions:  .  dolutegravir (TIVICAY) 50 MG tablet, Take 1 tablet (50 mg total) by mouth daily., Disp: 30 tablet, Rfl: 11 .  emtricitabine-rilpivir-tenofovir AF (ODEFSEY) 200-25-25 MG TABS tablet, Take 1 tablet by mouth daily with breakfast., Disp: 30 tablet, Rfl: 11 .  HYDROcodone-acetaminophen (NORCO/VICODIN) 5-325 MG tablet, Take 1-2 tablets by mouth every 4 (four) hours as needed for moderate pain. (Patient not taking: Reported on 06/26/2015), Disp: 30 tablet, Rfl: 0 .   sulfamethoxazole-trimethoprim (BACTRIM DS,SEPTRA DS) 800-160 MG tablet, Take 1 tablet by mouth 2 (two) times daily., Disp: 30 tablet, Rfl: 11   Past Medical History  Diagnosis Date  . HIV disease ([place])   . AIDS ([place]) 06/26/2015  . PCP (pneumocystis carinii pneumonia) ([place]) 06/26/2015  . Constipation 06/26/2015  . FUO (fever of unknown origin) 06/26/2015  . IRIS (immune reconstitution inflammatory syndrome) ([place]) 08/13/2015  . Rash and nonspecific skin eruption 08/13/2015    No past surgical history on file.  Family History  Problem Relation Age of Onset  . Cancer Mother   . Cancer Father   . Diabetes Sister       Social History   Social History  . Marital Status: Single    Spouse Name: N/A  . Number of Children: N/A  . Years of Education: N/A   Social History Main Topics  . Smoking status: Current Every Day Smoker -- 0.33 packs/day    Types: Cigarettes    Start date: 05/27/1999  . Smokeless tobacco: Never Used  . Alcohol Use: 0.0 oz/week    0 Standard drinks or equivalent per week     Comment: rarely  . Drug Use: No  . Sexual Activity: Not Currently   Other Topics Concern  . None   Social History Narrative    No Known Allergies   Current outpatient prescriptions:  .  dolutegravir (TIVICAY) 50 MG tablet, Take 1 tablet (50 mg total) by mouth daily., Disp: 30 tablet, Rfl: 11 .  emtricitabine-rilpivir-tenofovir AF (ODEFSEY) 200-25-25 MG TABS tablet, Take 1 tablet by mouth daily with breakfast., Disp: 30 tablet, Rfl: 11 .  HYDROcodone-acetaminophen (NORCO/VICODIN) 5-325 MG tablet, Take 1-2 tablets by mouth every 4 (four) hours as needed for moderate pain. (Patient not taking: Reported on 06/26/2015), Disp: 30 tablet, Rfl: 0 .  sulfamethoxazole-trimethoprim (BACTRIM DS,SEPTRA DS) 800-160 MG tablet, Take 1 tablet by mouth 2 (two) times daily., Disp: 30 tablet, Rfl: 11    Review of Systems  Constitutional: Positive for diaphoresis, activity change, fatigue and unexpected  weight change. Negative for appetite change.  HENT: Negative for congestion, rhinorrhea, sinus pressure, sneezing, sore throat and trouble swallowing.   Eyes: Negative for photophobia and visual disturbance.  Respiratory: Negative for cough, chest tightness, wheezing and stridor.   Cardiovascular: Negative for chest pain, palpitations and leg swelling.  Gastrointestinal: Positive for constipation. Negative for nausea, vomiting, abdominal pain, diarrhea, blood in stool, abdominal distention and anal bleeding.  Genitourinary: Negative for dysuria, hematuria, flank pain and difficulty urinating.  Musculoskeletal: Negative for myalgias, back pain, joint swelling, arthralgias and gait problem.  Skin: Positive for rash. Negative for color change, pallor and wound.  Neurological: Positive for dizziness, weakness and light-headedness. Negative for tremors.  Hematological: Negative for adenopathy. Does not bruise/bleed easily.  Psychiatric/Behavioral: Negative for behavioral problems, confusion,  sleep disturbance, dysphoric mood, decreased concentration and agitation.       Objective:   Physical Exam  Constitutional: He is oriented to person, place, and time.  HENT:  Head: Normocephalic and atraumatic.  Eyes: Conjunctivae and EOM are normal.  Neck: Normal range of motion. Neck supple.  Cardiovascular: Normal rate, regular rhythm and normal heart sounds.  Exam reveals no gallop and no friction rub.   No murmur heard. Pulmonary/Chest: Effort normal. No respiratory distress. He has no wheezes. He has no rales. He exhibits no tenderness.  Abdominal: Soft. He exhibits no distension.  Musculoskeletal: Normal range of motion. He exhibits no edema or tenderness.  Neurological: He is alert and oriented to person, place, and time.  Skin: Skin is warm and dry.  Psychiatric: He has a normal mood and affect. His behavior is normal. Judgment and thought content normal.  Nursing note and vitals  reviewed.   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 I have grave concerns re his insight and that he does not appreciate how sick he has been and how much remarkable progress he has made in little over one month  Bring back in one month for labs and then visit with me in 5 weeks  He does not want to take PCP prophylaxis  Rashes, fever, sweats: Undoubtedly due to IRIS recently. He needs to "ride this out.  Elevated LFTs: as above liklely due to IRIS. And certainly UNDER no foreseeable circumstances should be grounds for stopping his ARVs  Depression: to have him meet with counselor today. Might he have bipolar disorder or other psychiatric disorder that leads him to poor insight or ideas about herbal remedies or is this simply his personality. I have grave concerns though I am greatly encouraged by his progress in past month   I spent greater than 40 minutes with the patient including greater than 50% of time in face to face counsel of the patient re his HIV/AIDS, fevers, rash, IRIS, elevated lfts, .  and in coordination of their care.

## 2015-08-16 ENCOUNTER — Ambulatory Visit: Payer: Medicaid Other

## 2015-08-16 DIAGNOSIS — F4323 Adjustment disorder with mixed anxiety and depressed mood: Secondary | ICD-10-CM

## 2015-08-16 NOTE — BH Specialist Note (Signed)
I met with Brandon Hernandez today for the first time and he spent the first half hour crying and talking in vague terms about relationships and life in general, then began talking as though he were talking to his ex partner.  He said he feels like he is "coming out of" his depression.  He said he spent most of January crying, after his partner left.  They had been together for 2 & 1/2 years.  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 He rated his mood today at a 5.  I provided psycho-education on cognitive distortions and he denied that he does this, but then said he did do "should statements" and "labeling".  He denies any suicidal ideation.  He said he only has anxiety if he has to stand up in front of people.  Plan to meet again in one week. Curley Spice, LCSW

## 2015-08-19 NOTE — Progress Notes (Signed)
RN wanted to reconnect with the patient again to offer support and encouragment with medication adherence/compliance. Prior to patient's clinic visit with Dr Drucilla Schmidt today RN meet with the patient. Brandon Hernandez stated he has been doing much better with taking his medication and would be ok with speaking with me further

## 2015-08-21 ENCOUNTER — Ambulatory Visit: Payer: Medicaid Other

## 2015-09-10 ENCOUNTER — Other Ambulatory Visit: Payer: Medicaid Other

## 2015-09-10 DIAGNOSIS — B20 Human immunodeficiency virus [HIV] disease: Secondary | ICD-10-CM

## 2015-09-10 LAB — COMPLETE METABOLIC PANEL WITH GFR
ALBUMIN: 3.8 g/dL (ref 3.6–5.1)
ALK PHOS: 65 U/L (ref 40–115)
ALT: 12 U/L (ref 9–46)
AST: 18 U/L (ref 10–40)
BUN: 8 mg/dL (ref 7–25)
CO2: 29 mmol/L (ref 20–31)
Calcium: 9.5 mg/dL (ref 8.6–10.3)
Chloride: 101 mmol/L (ref 98–110)
Creat: 1.06 mg/dL (ref 0.60–1.35)
GLUCOSE: 84 mg/dL (ref 65–99)
POTASSIUM: 4.2 mmol/L (ref 3.5–5.3)
SODIUM: 139 mmol/L (ref 135–146)
Total Bilirubin: 0.5 mg/dL (ref 0.2–1.2)
Total Protein: 7.9 g/dL (ref 6.1–8.1)

## 2015-09-10 LAB — CBC WITH DIFFERENTIAL/PLATELET
BASOS ABS: 37 {cells}/uL (ref 0–200)
Basophils Relative: 1 %
EOS PCT: 15 %
Eosinophils Absolute: 555 cells/uL — ABNORMAL HIGH (ref 15–500)
HCT: 43.3 % (ref 38.5–50.0)
HEMOGLOBIN: 14.7 g/dL (ref 13.2–17.1)
LYMPHS ABS: 1554 {cells}/uL (ref 850–3900)
Lymphocytes Relative: 42 %
MCH: 33.4 pg — AB (ref 27.0–33.0)
MCHC: 33.9 g/dL (ref 32.0–36.0)
MCV: 98.4 fL (ref 80.0–100.0)
MONOS PCT: 9 %
MPV: 9.2 fL (ref 7.5–12.5)
Monocytes Absolute: 333 cells/uL (ref 200–950)
NEUTROS PCT: 33 %
Neutro Abs: 1221 cells/uL — ABNORMAL LOW (ref 1500–7800)
Platelets: 213 10*3/uL (ref 140–400)
RBC: 4.4 MIL/uL (ref 4.20–5.80)
RDW: 14.8 % (ref 11.0–15.0)
WBC: 3.7 10*3/uL — ABNORMAL LOW (ref 3.8–10.8)

## 2015-09-11 LAB — HIV-1 RNA ULTRAQUANT REFLEX TO GENTYP+
HIV 1 RNA Quant: 30 copies/mL — ABNORMAL HIGH (ref <20)
HIV-1 RNA Quant, Log: 1.48 Log copies/mL — ABNORMAL HIGH (ref <1.30)

## 2015-09-11 LAB — T-HELPER CELL (CD4) - (RCID CLINIC ONLY)
CD4 T CELL ABS: 90 /uL — AB (ref 400–2700)
CD4 T CELL HELPER: 6 % — AB (ref 33–55)

## 2015-09-11 LAB — RPR

## 2015-09-17 ENCOUNTER — Ambulatory Visit (INDEPENDENT_AMBULATORY_CARE_PROVIDER_SITE_OTHER): Payer: Medicaid Other | Admitting: Infectious Disease

## 2015-09-17 ENCOUNTER — Encounter: Payer: Self-pay | Admitting: Infectious Disease

## 2015-09-17 ENCOUNTER — Other Ambulatory Visit (HOSPITAL_COMMUNITY)
Admission: RE | Admit: 2015-09-17 | Discharge: 2015-09-17 | Disposition: A | Discharge status: Home / Self Care | Payer: Medicaid Other | Source: Ambulatory Visit | Attending: Infectious Disease | Admitting: Infectious Disease

## 2015-09-17 VITALS — BP 121/84 | HR 68 | Temp 98.1°F | Ht 72.0 in | Wt 161.0 lb

## 2015-09-17 DIAGNOSIS — N390 Urinary tract infection, site not specified: Secondary | ICD-10-CM | POA: Diagnosis not present

## 2015-09-17 DIAGNOSIS — R21 Rash and other nonspecific skin eruption: Secondary | ICD-10-CM

## 2015-09-17 DIAGNOSIS — B079 Viral wart, unspecified: Secondary | ICD-10-CM | POA: Diagnosis not present

## 2015-09-17 DIAGNOSIS — Z113 Encounter for screening for infections with a predominantly sexual mode of transmission: Secondary | ICD-10-CM | POA: Diagnosis not present

## 2015-09-17 DIAGNOSIS — B078 Other viral warts: Secondary | ICD-10-CM | POA: Diagnosis not present

## 2015-09-17 DIAGNOSIS — D893 Immune reconstitution syndrome: Secondary | ICD-10-CM

## 2015-09-17 DIAGNOSIS — B59 Pneumocystosis: Secondary | ICD-10-CM

## 2015-09-17 DIAGNOSIS — R82998 Other abnormal findings in urine: Secondary | ICD-10-CM

## 2015-09-17 DIAGNOSIS — R8299 Other abnormal findings in urine: Secondary | ICD-10-CM

## 2015-09-17 DIAGNOSIS — B20 Human immunodeficiency virus [HIV] disease: Secondary | ICD-10-CM | POA: Diagnosis not present

## 2015-09-17 HISTORY — DX: Viral wart, unspecified: B07.9

## 2015-09-17 HISTORY — DX: Other abnormal findings in urine: R82.998

## 2015-09-17 LAB — COMPLETE METABOLIC PANEL WITH GFR
ALT: 11 U/L (ref 9–46)
AST: 17 U/L (ref 10–40)
Albumin: 3.9 g/dL (ref 3.6–5.1)
Alkaline Phosphatase: 62 U/L (ref 40–115)
BUN: 8 mg/dL (ref 7–25)
CHLORIDE: 99 mmol/L (ref 98–110)
CO2: 28 mmol/L (ref 20–31)
Calcium: 8.9 mg/dL (ref 8.6–10.3)
Creat: 0.87 mg/dL (ref 0.60–1.35)
GFR, Est African American: >89 mL/min (ref >=60)
GFR, Est Non African American: >89 mL/min (ref >=60)
GLUCOSE: 88 mg/dL (ref 65–99)
POTASSIUM: 4.4 mmol/L (ref 3.5–5.3)
SODIUM: 137 mmol/L (ref 135–146)
Total Bilirubin: 0.4 mg/dL (ref 0.2–1.2)
Total Protein: 7.4 g/dL (ref 6.1–8.1)

## 2015-09-17 LAB — HEMOGLOBIN A1C
Hgb A1c MFr Bld: 5.3 % (ref <5.7)
Mean Plasma Glucose: 105 mg/dL
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
## 2015-09-18 LAB — T-HELPER CELL (CD4) - (RCID CLINIC ONLY)
CD4 % Helper T Cell: 6 % — ABNORMAL LOW (ref 33–55)
CD4 T Cell Abs: 100 /uL — ABNORMAL LOW (ref 400–2700)

## 2015-09-18 LAB — URINE CYTOLOGY ANCILLARY ONLY
Chlamydia: NEGATIVE
Neisseria Gonorrhea: NEGATIVE

## 2015-09-20 ENCOUNTER — Telehealth: Payer: Self-pay

## 2015-09-20 NOTE — Telephone Encounter (Signed)
Dermatology referral sent to [facility].  Rodman Key, LPN  Notified patient that referral was made and that either [facility] or this office will notify him of referral status. Candace Murray,LPN

## 2015-09-21 LAB — HIV RNA, RTPCR W/R GT (RTI, PI,INT)
HIV 1 RNA Quant: 37800 copies/mL — ABNORMAL HIGH
HIV-1 RNA Quant, Log: 4.58 Log copies/mL — ABNORMAL HIGH

## 2015-09-25 NOTE — Progress Notes (Signed)
Please advise dosing and number of refills for dapsone. Thank you! Landis Gandy, RN

## 2015-09-26 ENCOUNTER — Telehealth: Payer: Self-pay | Admitting: *Deleted

## 2015-09-26 ENCOUNTER — Ambulatory Visit (INDEPENDENT_AMBULATORY_CARE_PROVIDER_SITE_OTHER): Payer: Medicaid Other | Admitting: Pharmacist Clinician (PhC)/ Clinical Pharmacy Specialist

## 2015-09-26 ENCOUNTER — Other Ambulatory Visit: Payer: Self-pay | Admitting: *Deleted

## 2015-09-26 DIAGNOSIS — B20 Human immunodeficiency virus [HIV] disease: Secondary | ICD-10-CM

## 2015-09-26 LAB — HIV-1 GENOTYPE: HIV-1 GENOTYPE: DETECTED — AB

## 2015-09-26 LAB — RFLX HIV-1 INTEGRASE GENOTYPE

## 2015-09-26 MED ORDER — DOLUTEGRAVIR SODIUM 50 MG PO TABS: 50.0000 mg | ORAL_TABLET | Freq: Every day | ORAL | Status: AC

## 2015-09-26 MED ORDER — EMTRICITAB-RILPIVIR-TENOFOV AF 200-25-25 MG PO TABS: 1.0000 | ORAL_TABLET | Freq: Every day | ORAL | Status: AC

## 2015-09-26 MED ORDER — SULFAMETHOXAZOLE-TRIMETHOPRIM 400-80 MG PO TABS: 1.0000 | ORAL_TABLET | Freq: Every day | ORAL | Status: AC

## 2015-09-26 NOTE — Patient Instructions (Signed)
Take Odefsey 1 tablet daily Take Tivicay 50mg  daily Start Septra/Bactrim 1 tablet daily

## 2015-09-26 NOTE — Telephone Encounter (Signed)
[facility]

## 2015-09-26 NOTE — Progress Notes (Signed)
Patient ID: Brandon Hernandez, male   DOB: 06-28-1980, 35 y.o.   MRN: [id number] HPI: Brandon Hernandez is a 35 y.o. male who is here to f/u with pharmacy for his adherence issue.   Allergies: No Known Allergies  Vitals:    Past Medical History: Past Medical History  Diagnosis Date  . HIV disease ([place])   . AIDS ([place]) 06/26/2015  . PCP (pneumocystis carinii pneumonia) ([place]) 06/26/2015  . Constipation 06/26/2015  . FUO (fever of unknown origin) 06/26/2015  . IRIS (immune reconstitution inflammatory syndrome) ([place]) 08/13/2015  . Rash and nonspecific skin eruption 08/13/2015  . Elevated LFTs 08/13/2015  . Wart 09/17/2015  . Dark urine 09/17/2015    Social History: Social History   Social History  . Marital Status: Single    Spouse Name: N/A  . Number of Children: N/A  . Years of Education: N/A   Social History Main Topics  . Smoking status: Current Every Day Smoker -- 0.33 packs/day    Types: Cigarettes    Start date: 05/27/1999  . Smokeless tobacco: Never Used  . Alcohol Use: 0.0 oz/week    0 Standard drinks or equivalent per week     Comment: rarely  . Drug Use: No  . Sexual Activity: Not Currently     Comment: PT DECLINED CONDOMS   Other Topics Concern  . Not on file   Social History Narrative    Previous Regimen:   Current Regimen: Odefsey/DTG  Labs: HIV 1 RNA QUANT (copies/mL)  Date Value  09/17/2015 37800*  09/10/2015 30*  07/30/2015 289*   CD4 T CELL ABS (/uL)  Date Value  09/17/2015 100*  09/10/2015 90*  07/30/2015 200*   HEPATITIS B SURFACE AG (no units)  Date Value  06/18/2015 Negative   HCV AB (s/co ratio)  Date Value  06/18/2015 0.1    CrCl: Estimated Creatinine Clearance: 125.9 mL/min (by C-G formula based on Cr of 0.87).  Lipids:    Component Value Date/Time   CHOL 170 07/30/2015 1149   TRIG 97 07/30/2015 1149   HDL 46 07/30/2015 1149   CHOLHDL 3.7 07/30/2015 1149   VLDL 19 07/30/2015 1149   LDLCALC 105 07/30/2015 1149     Assessment: [place] recently saw Brandon Hernandez in the clinic for his HIV f/u. His VL rebound again due to non-compliance. He stated at the time that he was doing better but his VL said otherwise. His CD4 is also very low so he is at a high risk for OIs. I spent greater than 31mins with him to address adherence. He stated that he often forgets to take his meds. I made him set up a daily alarm on his phone to go off everyday as a reminder. He did it in front of me. He has active medicaid right. A couple of things we are going to do today. First, transfer his rx to [facility] so we can have a better way of keeping track of it. Second, start him on low dose septra for PCP px. Stressed to him that he is only on 3 tabs per day now. He is going to try to pick up the septra today. He has an appt to see Dr. Tommy Medal at the end of May.   Recommendations:  Cont current regimen Septra SS 1 PO qday F/u with Dr. Tommy Medal end of May  Pham, Minh Wynona, PharmD Clinical Infectious [facility] 09/26/2015, 1:51 PM

## 2015-09-26 NOTE — Telephone Encounter (Signed)
Patient wanted to meet with Onnie Boer, pharmacist prior to starting new medication. Scheduled for today at 11:00 AM. Myrtis Hopping

## 2015-09-26 NOTE — Telephone Encounter (Signed)
-----   Message from Truman Hayward, MD sent at 09/23/2015  9:36 PM EDT ----- Patient needs to start on daspone daily for PCP prophyaxis as well ----- Message -----    From: SYSTEM    Sent: 09/22/2015  12:04 AM      To: Truman Hayward, MD

## 2015-10-24 ENCOUNTER — Ambulatory Visit: Payer: Medicaid Other | Admitting: Infectious Disease

## 2015-11-14 MED FILL — ODEFSEY 200-25-25 MG TABS: 200-25-25 | 30 days supply | Qty: 30 | Fill #0 | Status: TO

## 2015-11-14 MED FILL — TIVICAY 50 MG TABLET: 50 | 30 days supply | Qty: 30 | Fill #0 | Status: TO

## 2015-11-15 MED FILL — SULFAMETHOXAZOLE/TMP SS TAB: 400-80 | 30 days supply | Qty: 30 | Fill #0 | Status: TO

## 2016-01-03 ENCOUNTER — Telehealth: Payer: Self-pay | Admitting: Infectious Disease

## 2016-01-03 ENCOUNTER — Telehealth: Payer: Self-pay | Admitting: *Deleted

## 2016-01-03 NOTE — Telephone Encounter (Signed)
Physician with [facility] called requesting to talk to patient's MD. He was just dx with kaposi sarcoma. Dr. Lucianne Lei Dam's pager # given. Myrtis Hopping

## 2016-01-03 NOTE — Telephone Encounter (Signed)
Referral made to [facility]. Brandon Hernandez

## 2016-01-03 NOTE — Telephone Encounter (Signed)
Perfect

## 2016-01-03 NOTE — Telephone Encounter (Signed)
Brandon Hernandez saw [employer]. From [facility] diagnosed with Kaposi's  Sarcoma. He has a VERY large lesion  They will put referral into Oncology but this guy with TERRIBLE INSIGHT needs to get back on ARV  Can we send him a  Engineer, materials get him into see pharamcy and me and get him back on his ARV's ASAP

## 2016-01-03 NOTE — Telephone Encounter (Signed)
I spoke with her and I want bridge counselor to go get him and bring him to clinic to see pharmacy get labs and get back on his MEDS and see me afterwards

## 2016-01-07 NOTE — Telephone Encounter (Signed)
Since he is going to be manage for this over in [place], would it be more feasible to transfer his HIV care over there?

## 2016-01-07 NOTE — Telephone Encounter (Signed)
He WAS in care at [facility] and then came to [name] after an admission. Before he was at [facility] he was being seen by [name] in [place]

## 2017-02-10 ENCOUNTER — Emergency Department (HOSPITAL_COMMUNITY): Payer: Medicaid Other

## 2017-02-10 ENCOUNTER — Inpatient Hospital Stay (HOSPITAL_COMMUNITY)
Admission: EM | Admit: 2017-02-10 | Discharge: 2017-02-11 | DRG: 974 | Discharge status: Against Medical Advice | Payer: Medicaid Other | Attending: Internal Medicine | Admitting: Internal Medicine

## 2017-02-10 ENCOUNTER — Encounter (HOSPITAL_COMMUNITY): Payer: Self-pay | Admitting: *Deleted

## 2017-02-10 DIAGNOSIS — I517 Cardiomegaly: Secondary | ICD-10-CM | POA: Diagnosis present

## 2017-02-10 DIAGNOSIS — J189 Pneumonia, unspecified organism: Secondary | ICD-10-CM

## 2017-02-10 DIAGNOSIS — F17210 Nicotine dependence, cigarettes, uncomplicated: Secondary | ICD-10-CM | POA: Diagnosis present

## 2017-02-10 DIAGNOSIS — Z8589 Personal history of malignant neoplasm of other organs and systems: Secondary | ICD-10-CM | POA: Diagnosis not present

## 2017-02-10 DIAGNOSIS — C469 Kaposi's sarcoma, unspecified: Secondary | ICD-10-CM | POA: Diagnosis present

## 2017-02-10 DIAGNOSIS — D638 Anemia in other chronic diseases classified elsewhere: Secondary | ICD-10-CM | POA: Diagnosis present

## 2017-02-10 DIAGNOSIS — D849 Immunodeficiency, unspecified: Secondary | ICD-10-CM

## 2017-02-10 DIAGNOSIS — D709 Neutropenia, unspecified: Secondary | ICD-10-CM | POA: Diagnosis present

## 2017-02-10 DIAGNOSIS — Z5321 Procedure and treatment not carried out due to patient leaving prior to being seen by health care provider: Secondary | ICD-10-CM | POA: Diagnosis present

## 2017-02-10 DIAGNOSIS — R000 Tachycardia, unspecified: Secondary | ICD-10-CM | POA: Diagnosis present

## 2017-02-10 DIAGNOSIS — B59 Pneumocystosis: Principal | ICD-10-CM | POA: Diagnosis present

## 2017-02-10 DIAGNOSIS — B379 Candidiasis, unspecified: Secondary | ICD-10-CM | POA: Diagnosis present

## 2017-02-10 DIAGNOSIS — D899 Disorder involving the immune mechanism, unspecified: Secondary | ICD-10-CM

## 2017-02-10 DIAGNOSIS — J9601 Acute respiratory failure with hypoxia: Secondary | ICD-10-CM | POA: Diagnosis not present

## 2017-02-10 DIAGNOSIS — J90 Pleural effusion, not elsewhere classified: Secondary | ICD-10-CM | POA: Diagnosis present

## 2017-02-10 DIAGNOSIS — Z833 Family history of diabetes mellitus: Secondary | ICD-10-CM

## 2017-02-10 DIAGNOSIS — B20 Human immunodeficiency virus [HIV] disease: Secondary | ICD-10-CM | POA: Diagnosis present

## 2017-02-10 DIAGNOSIS — Z9114 Patient's other noncompliance with medication regimen: Secondary | ICD-10-CM

## 2017-02-10 DIAGNOSIS — R042 Hemoptysis: Secondary | ICD-10-CM | POA: Diagnosis present

## 2017-02-10 DIAGNOSIS — D649 Anemia, unspecified: Secondary | ICD-10-CM | POA: Diagnosis present

## 2017-02-10 DIAGNOSIS — Z9119 Patient's noncompliance with other medical treatment and regimen: Secondary | ICD-10-CM | POA: Diagnosis not present

## 2017-02-10 DIAGNOSIS — L989 Disorder of the skin and subcutaneous tissue, unspecified: Secondary | ICD-10-CM | POA: Diagnosis present

## 2017-02-10 DIAGNOSIS — B370 Candidal stomatitis: Secondary | ICD-10-CM | POA: Diagnosis not present

## 2017-02-10 DIAGNOSIS — B3781 Candidal esophagitis: Secondary | ICD-10-CM | POA: Diagnosis not present

## 2017-02-10 LAB — CBC WITH DIFFERENTIAL/PLATELET
BASOS PCT: 2 %
Basophils Absolute: 0 10*3/uL (ref 0.0–0.1)
EOS ABS: 0.1 10*3/uL (ref 0.0–0.7)
Eosinophils Relative: 7 %
HCT: 25.1 % — ABNORMAL LOW (ref 39.0–52.0)
Hemoglobin: 8.1 g/dL — ABNORMAL LOW (ref 13.0–17.0)
LYMPHS ABS: 0.4 10*3/uL — AB (ref 0.7–4.0)
Lymphocytes Relative: 25 %
MCH: 31.3 pg (ref 26.0–34.0)
MCHC: 32.3 g/dL (ref 30.0–36.0)
MCV: 96.9 fL (ref 78.0–100.0)
MONO ABS: 0.1 10*3/uL (ref 0.1–1.0)
MONOS PCT: 6 %
Neutro Abs: 1 10*3/uL — ABNORMAL LOW (ref 1.7–7.7)
Neutrophils Relative %: 60 %
PLATELETS: 197 10*3/uL (ref 150–400)
RBC: 2.59 MIL/uL — ABNORMAL LOW (ref 4.22–5.81)
RDW: 17.4 % — AB (ref 11.5–15.5)
WBC: 1.7 10*3/uL — ABNORMAL LOW (ref 4.0–10.5)

## 2017-02-10 LAB — COMPREHENSIVE METABOLIC PANEL
ALBUMIN: 2.9 g/dL — AB (ref 3.5–5.0)
ALK PHOS: 66 U/L (ref 38–126)
ALT: 28 U/L (ref 17–63)
ANION GAP: 6 (ref 5–15)
AST: 70 U/L — ABNORMAL HIGH (ref 15–41)
BUN: 9 mg/dL (ref 6–20)
CO2: 27 mmol/L (ref 22–32)
CREATININE: 0.9 mg/dL (ref 0.61–1.24)
Calcium: 8.9 mg/dL (ref 8.9–10.3)
Chloride: 99 mmol/L — ABNORMAL LOW (ref 101–111)
GFR calc non Af Amer: >60 mL/min (ref >60)
Glucose, Bld: 84 mg/dL (ref 65–99)
POTASSIUM: 4.4 mmol/L (ref 3.5–5.1)
SODIUM: 132 mmol/L — AB (ref 135–145)
TOTAL PROTEIN: 8.9 g/dL — AB (ref 6.5–8.1)
Total Bilirubin: 0.6 mg/dL (ref 0.3–1.2)

## 2017-02-10 LAB — D-DIMER, QUANTITATIVE (NOT AT ARMC): D DIMER QUANT: 7.87 ug{FEU}/mL — AB (ref 0.00–0.50)

## 2017-02-10 MED ORDER — SODIUM CHLORIDE 0.9 % IV BOLUS (SEPSIS)
1000.0000 mL | Freq: Once | INTRAVENOUS | Status: AC
  Administered 2017-02-10: 1000 mL via INTRAVENOUS

## 2017-02-10 MED ORDER — DEXTROSE 5 % IV SOLN
500.0000 mg | Freq: Once | INTRAVENOUS | Status: AC
  Administered 2017-02-10: 500 mg via INTRAVENOUS
  Filled 2017-02-10: qty 500

## 2017-02-10 MED ORDER — DEXTROSE 5 % IV SOLN
1.0000 g | Freq: Once | INTRAVENOUS | Status: AC
  Administered 2017-02-10: 1 g via INTRAVENOUS
  Filled 2017-02-10: qty 10

## 2017-02-10 MED ORDER — IOPAMIDOL (ISOVUE-370) INJECTION 76%
INTRAVENOUS | Status: AC
  Administered 2017-02-10: 100 mL
  Filled 2017-02-10: qty 100

## 2017-02-10 NOTE — ED Triage Notes (Signed)
To ED for eval of coughing blood this am. No continuous cough noted. Pt has been called for triage multiple times throughout the day and no answer until now. Pt alert and oriented. States he has just moved back from [place]. Also needs his feet evaluated due to bilateral numbness for months. Denies sob. Denies fevers at home.

## 2017-02-10 NOTE — ED Provider Notes (Signed)
[facility] DEPT Provider Note   CSN: [id number] Arrival date & time: 02/10/17  1223     History   Chief Complaint Chief Complaint  Patient presents with  . Cough    HPI ORDEAN FOUTS is a 36 y.o. male.  HPI 36 year old male who presents with cough. He has a history of HIV/AIDS, and he states he has not been compliant with his antiretroviral medications for quite some time. He recently relocated from [place] about a month ago. Over the past 2 days has had increased nonproductive cough at nighttime, and tonight when he coughed noticed large amount of sputum with streaks of blood within it. Has not had nausea, vomiting, hematemesis, melena or hematochezia. Has not had shortness of breath or chest pain. Has not had syncope or near syncope. Also reports that he has had numbness in bilateral feet that goes up the shins over the past several months. No focal weakness. He is also had some isolated left leg swelling that has been ongoing for one year. Past Medical History:  Diagnosis Date  . AIDS ([place]) 06/26/2015  . Constipation 06/26/2015  . Dark urine 09/17/2015  . Elevated LFTs 08/13/2015  . FUO (fever of unknown origin) 06/26/2015  . HIV disease ([place])   . 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 PCP (pneumocystis carinii pneumonia) ([place]) 06/26/2015  . Rash and nonspecific skin eruption 08/13/2015  . Wart 09/17/2015    Patient Active Problem List   Diagnosis Date Noted  . Acute respiratory failure with hypoxia ([place]) 02/10/2017  . Wart 09/17/2015  . Dark urine 09/17/2015  . 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 Rash and nonspecific skin eruption 08/13/2015  . Medically noncompliant 08/13/2015  . Elevated LFTs 08/13/2015  . AIDS ([place]) 06/26/2015  . PCP (pneumocystis carinii pneumonia) ([place]) 06/26/2015  . Constipation 06/26/2015  . FUO (fever of unknown origin) 06/26/2015  . AIDS (acquired immune deficiency syndrome)  ([place]) 06/18/2015  . UTI (lower urinary tract infection) 06/18/2015  . Complicated UTI (urinary tract infection) 06/18/2015  . Acid reflux 03/15/2014  . H/O infectious disease 05/24/2013  . 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 Human immunodeficiency virus (HIV) infection ([place]) 02/26/2011  . Abnormal anal Papanicolaou smear 02/26/2011  . Common wart 02/26/2011    History reviewed. No pertinent surgical history.     Home Medications    Prior to Admission medications   Medication Sig Start Date End Date Taking? Authorizing Provider  dolutegravir (TIVICAY) 50 MG tablet Take 1 tablet (50 mg total) by mouth daily. Patient not taking: Reported on 02/10/2017 09/26/15   Tommy Medal, Lavell Islam, MD  emtricitabine-rilpivir-tenofovir AF (ODEFSEY) 200-25-25 MG TABS tablet Take 1 tablet by mouth daily with breakfast. Patient not taking: Reported on 02/10/2017 09/26/15   Tommy Medal, Lavell Islam, MD  sulfamethoxazole-trimethoprim (BACTRIM) 400-80 MG tablet Take 1 tablet by mouth daily. Patient not taking: Reported on 02/10/2017 09/26/15   Tommy Medal, Lavell Islam, MD    Family History Family History  Problem Relation Age of Onset  . Cancer Mother   . Cancer Father   . Diabetes Sister     Social History Social History  Substance Use Topics  . Smoking status: Current Every Day Smoker    Packs/day: 0.33    Types: Cigarettes    Start date: 05/27/1999  . Smokeless tobacco: Never Used  . Alcohol use 0.0 oz/week     Comment: rarely     Allergies   Patient has no known allergies.  Review of Systems Review of Systems  Constitutional: Negative for fever.  Respiratory: Positive for cough. Negative for shortness of breath.   Cardiovascular: Positive for leg swelling. Negative for chest pain.  Gastrointestinal: Negative for abdominal pain.  Allergic/Immunologic: Positive for immunocompromised state.  All other systems reviewed and are negative.    Physical Exam Updated Vital Signs BP 107/75   Pulse  (!) 103   Temp 98.3 F (36.8 C) (Oral)   Resp 16   SpO2 96%   Physical Exam Physical Exam  Nursing note and vitals reviewed. Constitutional: Well developed, well nourished, non-toxic, and in no acute distress Head: Normocephalic and atraumatic.  Mouth/Throat: Oropharynx is clear and moist.  Neck: Normal range of motion. Neck supple.  Cardiovascular: Tachycardic rate and regular rhythm.   Pulmonary/Chest: Effort normal and breath sounds with faint expiratory wheeze Abdominal: Soft. There is no tenderness. There is no rebound and no guarding.  Musculoskeletal: Normal range of motion. Mild LLE edema to the knee, slight warmth and erythematous Neurological: Alert, no facial droop, fluent speech, moves all extremities symmetrically Skin: Skin is warm and dry. Kaposi sarcoma lesions diffusely over torso Psychiatric: Cooperative   ED Treatments / Results  Labs (all labs ordered are listed, but only abnormal results are displayed) Labs Reviewed  CBC WITH DIFFERENTIAL/PLATELET - Abnormal; Notable for the following:       Result Value   WBC 1.7 (*)    RBC 2.59 (*)    Hemoglobin 8.1 (*)    HCT 25.1 (*)    RDW 17.4 (*)    Neutro Abs 1.0 (*)    Lymphs Abs 0.4 (*)    All other components within normal limits  COMPREHENSIVE METABOLIC PANEL - Abnormal; Notable for the following:    Sodium 132 (*)    Chloride 99 (*)    Total Protein 8.9 (*)    Albumin 2.9 (*)    AST 70 (*)    All other components within normal limits  D-DIMER, QUANTITATIVE (NOT AT [facility]) - Abnormal; Notable for the following:    D-Dimer, Quant 7.87 (*)    All other components within normal limits  LACTATE DEHYDROGENASE  QUANTIFERON TB GOLD ASSAY (BLOOD)  HIV 1 RNA QUANT-NO REFLEX-BLD  T-HELPER CELLS (CD4) COUNT (NOT AT [facility])    EKG  EKG Interpretation  Date/Time:  Tuesday February 10 2017 20:33:20 EDT Ventricular Rate:  99 PR Interval:    QRS Duration: 92 QT Interval:  346 QTC Calculation: 444 R  Axis:   98 Text Interpretation:  Sinus rhythm Borderline right axis deviation ST elev, probable normal early repol pattern no acute changes  Confirmed by Brantley Stage [id number]) on 02/10/2017 8:37:15 PM       Radiology Dg Chest 2 View  Result Date: 02/10/2017 CLINICAL DATA:  Cough and hemoptysis.  Bilateral foot numbness. EXAM: CHEST  2 VIEW COMPARISON:  06/18/2015 FINDINGS: Normal heart size and pulmonary vascularity. There is peribronchial thickening with mild bronchiectasis and perihilar and basilar interstitial changes in the lungs. Findings demonstrate progression since previous study. This likely indicates chronic bronchitis or airways disease with superimposed acute bronchitis. Other interstitial pneumonia could also have this appearance. No focal consolidation. Blunting of the costophrenic angles is new since prior study suggesting small pleural effusions. No pneumothorax. Mediastinal contours appear intact. IMPRESSION: Acute on chronic bronchitic changes with evidence of acute peribronchial/ interstitial process. Electronically Signed   By: Lucienne Capers M.D.   On: 02/10/2017 21:06   Ct Angio Chest Pe W  And/or Wo Contrast  Result Date: 02/10/2017 CLINICAL DATA:  Hemoptysis. Positive D-dimer. History of HIV, aids, and immune reconstitution inflammatory syndrome. EXAM: CT ANGIOGRAPHY CHEST WITH CONTRAST TECHNIQUE: Multidetector CT imaging of the chest was performed using the standard protocol during bolus administration of intravenous contrast. Multiplanar CT image reconstructions and MIPs were obtained to evaluate the vascular anatomy. CONTRAST:  64 mL Isovue 370 COMPARISON:  03/14/2010 FINDINGS: Cardiovascular: Satisfactory opacification of the pulmonary arteries to the segmental level. No evidence of pulmonary embolism. Cardiac enlargement. Small pericardial effusion. Normal caliber thoracic aorta. No aneurysm or dissection. Great vessel origins are patent. Mediastinum/Nodes: Mediastinal  lymphadenopathy is demonstrated with moderately enlarged nodes in the aortopulmonic window, left paratracheal, subcarinal, and pretracheal regions. Largest nodes in the left paratracheal region measure about 1.5 cm diameter. Given the history, these could represent inflammatory or reactive nodes. Lymphoproliferative disorder or metastasis not excluded. Lymph nodes have enlarged since previous study. Esophagus is decompressed. Lungs/Pleura: Small right pleural effusion. Patchy airspace infiltrative changes demonstrated diffusely throughout the right lung and in a perihilar distribution of the left lung. This may represent edema, multifocal pneumonia, atypical pneumonia, or other inflammatory process. Peribronchial thickening consistent with bronchitis. Airways are patent. No pneumothorax. Upper Abdomen: Limit evaluation. No acute abnormality demonstrated in the visualized portion. Musculoskeletal: No chest wall abnormality. No acute or significant osseous findings. Review of the MIP images confirms the above findings. IMPRESSION: 1. No evidence of significant pulmonary embolus. 2. Cardiac enlargement. 3. Nonspecific mediastinal lymphadenopathy. 4. Diffuse patchy airspace infiltrative changes in both lungs, more prominent on the right. Bronchial wall thickening. This may represent edema, multifocal pneumonia, atypical pneumonia, or other inflammatory process. Consider TB or fungal etiologies in an immune compromised patient. 5. Right pleural effusion.  Small pericardial effusion. Electronically Signed   By: Lucienne Capers M.D.   On: 02/10/2017 22:42    Procedures Procedures (including critical care time)  Medications Ordered in ED Medications  azithromycin (ZITHROMAX) 500 mg in dextrose 5 % 250 mL IVPB (500 mg Intravenous New Bag/Given 02/10/17 2328)  sodium chloride 0.9 % bolus 1,000 mL (0 mLs Intravenous Stopped 02/10/17 2124)  iopamidol (ISOVUE-370) 76 % injection (100 mLs  Contrast Given 02/10/17 2217)   cefTRIAXone (ROCEPHIN) 1 g in dextrose 5 % 50 mL IVPB (1 g Intravenous New Bag/Given 02/10/17 2328)     Initial Impression / Assessment and Plan / ED Course  I have reviewed the triage vital signs and the nursing notes.  Pertinent labs & imaging results that were available during my care of the patient were reviewed by me and considered in my medical decision making (see chart for details).     36 year old male who presents with 2 days of cough. He is significantly immunosuppressed due to his AIDS, and has not been compliant with his HIV medications. Signs of advanced HIV on exam, including multiple lesions of Kaposi sarcoma. He is mildly tachycardic, but afebrile, in no respiratory distress. Chest x-ray shows possible bronchitic picture. Given recent travel, d-dimer was sent, and is positive. CT imaging of the chest obtained, visualized. There is no evidence of PE but he has diffuse bilateral upper chest that could be suggestive of multifocal pneumonia. Given his immune suppression I did start him on ceftriaxone and azithromycin. Discussed with Dr. Hal Hope who will admit for observation for ongoing management and possible ID consult.  Final Clinical Impressions(s) / ED Diagnoses   Final diagnoses:  Immunosuppressed status ([place])  Community acquired pneumonia, unspecified laterality    New Prescriptions  New Prescriptions   No medications on file     Forde Dandy, MD 02/11/17 0002

## 2017-02-10 NOTE — ED Notes (Signed)
Pt. Stated, I must have been in the bathroom or asleep when I was called, but Im here.

## 2017-02-10 NOTE — ED Notes (Signed)
Called for triage. No answer. 

## 2017-02-11 ENCOUNTER — Other Ambulatory Visit (HOSPITAL_COMMUNITY): Payer: Medicaid Other

## 2017-02-11 ENCOUNTER — Encounter (HOSPITAL_COMMUNITY): Payer: Self-pay | Admitting: Internal Medicine

## 2017-02-11 ENCOUNTER — Other Ambulatory Visit: Payer: Self-pay | Admitting: Infectious Diseases

## 2017-02-11 DIAGNOSIS — J189 Pneumonia, unspecified organism: Secondary | ICD-10-CM | POA: Diagnosis present

## 2017-02-11 DIAGNOSIS — D649 Anemia, unspecified: Secondary | ICD-10-CM | POA: Diagnosis present

## 2017-02-11 DIAGNOSIS — B3781 Candidal esophagitis: Secondary | ICD-10-CM

## 2017-02-11 DIAGNOSIS — B20 Human immunodeficiency virus [HIV] disease: Secondary | ICD-10-CM

## 2017-02-11 DIAGNOSIS — B370 Candidal stomatitis: Secondary | ICD-10-CM

## 2017-02-11 DIAGNOSIS — C469 Kaposi's sarcoma, unspecified: Secondary | ICD-10-CM

## 2017-02-11 DIAGNOSIS — J9601 Acute respiratory failure with hypoxia: Secondary | ICD-10-CM

## 2017-02-11 LAB — BASIC METABOLIC PANEL
ANION GAP: 6 (ref 5–15)
BUN: 8 mg/dL (ref 6–20)
CALCIUM: 8.8 mg/dL — AB (ref 8.9–10.3)
CO2: 28 mmol/L (ref 22–32)
Chloride: 99 mmol/L — ABNORMAL LOW (ref 101–111)
Creatinine, Ser: 0.81 mg/dL (ref 0.61–1.24)
GFR calc non Af Amer: >60 mL/min (ref >60)
Glucose, Bld: 92 mg/dL (ref 65–99)
Potassium: 4 mmol/L (ref 3.5–5.1)
SODIUM: 133 mmol/L — AB (ref 135–145)

## 2017-02-11 LAB — FERRITIN: Ferritin: 880 ng/mL — ABNORMAL HIGH (ref 24–336)

## 2017-02-11 LAB — EXPECTORATED SPUTUM ASSESSMENT W GRAM STAIN, RFLX TO RESP C

## 2017-02-11 LAB — RAPID URINE DRUG SCREEN, HOSP PERFORMED
AMPHETAMINES: NOT DETECTED
BARBITURATES: NOT DETECTED
BENZODIAZEPINES: NOT DETECTED
COCAINE: NOT DETECTED
Opiates: NOT DETECTED
Tetrahydrocannabinol: POSITIVE — AB

## 2017-02-11 LAB — VITAMIN B12: VITAMIN B 12: 2490 pg/mL — AB (ref 180–914)

## 2017-02-11 LAB — CBC
HCT: 37.9 % — ABNORMAL LOW (ref 39.0–52.0)
HEMOGLOBIN: 12.4 g/dL — AB (ref 13.0–17.0)
MCH: 31.4 pg (ref 26.0–34.0)
MCHC: 32.7 g/dL (ref 30.0–36.0)
MCV: 95.9 fL (ref 78.0–100.0)
PLATELETS: 141 10*3/uL — AB (ref 150–400)
RBC: 3.95 MIL/uL — AB (ref 4.22–5.81)
RDW: 17.1 % — ABNORMAL HIGH (ref 11.5–15.5)
WBC: 1.1 10*3/uL — AB (ref 4.0–10.5)

## 2017-02-11 LAB — I-STAT ARTERIAL BLOOD GAS, ED
Acid-Base Excess: 4 mmol/L — ABNORMAL HIGH (ref 0.0–2.0)
BICARBONATE: 27.8 mmol/L (ref 20.0–28.0)
O2 SAT: 96 %
PCO2 ART: 36.4 mmHg (ref 32.0–48.0)
PH ART: 7.49 — AB (ref 7.350–7.450)
PO2 ART: 73 mmHg — AB (ref 83.0–108.0)
TCO2: 29 mmol/L (ref 22–32)

## 2017-02-11 LAB — EXPECTORATED SPUTUM ASSESSMENT W REFEX TO RESP CULTURE

## 2017-02-11 LAB — IRON AND TIBC
Iron: 60 ug/dL (ref 45–182)
SATURATION RATIOS: 42 % — AB (ref 17.9–39.5)
TIBC: 144 ug/dL — AB (ref 250–450)
UIBC: 84 ug/dL

## 2017-02-11 LAB — T-HELPER CELLS (CD4) COUNT (NOT AT ARMC)
CD4 T CELL ABS: 10 /uL — AB (ref 400–2700)
CD4 T CELL HELPER: 5 % — AB (ref 33–55)

## 2017-02-11 LAB — FOLATE: FOLATE: 11.6 ng/mL (ref >5.9)

## 2017-02-11 LAB — STREP PNEUMONIAE URINARY ANTIGEN: Strep Pneumo Urinary Antigen: NEGATIVE

## 2017-02-11 LAB — RETICULOCYTES
RBC.: 3.93 MIL/uL — ABNORMAL LOW (ref 4.22–5.81)
RETIC CT PCT: 3.1 % (ref 0.4–3.1)
Retic Count, Absolute: 121.8 10*3/uL (ref 19.0–186.0)

## 2017-02-11 LAB — LACTATE DEHYDROGENASE: LDH: 399 U/L — ABNORMAL HIGH (ref 98–192)

## 2017-02-11 MED ORDER — SULFAMETHOXAZOLE-TRIMETHOPRIM 400-80 MG/5ML IV SOLN
350.0000 mg | Freq: Three times a day (TID) | INTRAVENOUS | Status: DC
  Administered 2017-02-11: 350 mg via INTRAVENOUS
  Filled 2017-02-11: qty 21.88
  Filled 2017-02-11 (×2): qty 21.9

## 2017-02-11 MED ORDER — NICOTINE 7 MG/24HR TD PT24
7.0000 mg | MEDICATED_PATCH | Freq: Every day | TRANSDERMAL | Status: DC
  Filled 2017-02-11: qty 1

## 2017-02-11 MED ORDER — SULFAMETHOXAZOLE-TRIMETHOPRIM 800-160 MG PO TABS: 2.0000 | ORAL_TABLET | Freq: Three times a day (TID) | ORAL | 0 refills | Status: DC

## 2017-02-11 MED ORDER — SULFAMETHOXAZOLE-TRIMETHOPRIM 800-160 MG PO TABS: 2.0000 | ORAL_TABLET | Freq: Three times a day (TID) | ORAL | 0 refills | Status: AC

## 2017-02-11 MED ORDER — SULFAMETHOXAZOLE-TRIMETHOPRIM 800-160 MG PO TABS: 2.0000 | ORAL_TABLET | Freq: Four times a day (QID) | ORAL | 0 refills | Status: DC

## 2017-02-11 MED ORDER — DEXTROSE 5 % IV SOLN: 1.0000 g | INTRAVENOUS | Status: DC

## 2017-02-11 MED ORDER — FLUCONAZOLE 200 MG PO TABS: ORAL_TABLET | ORAL | 0 refills | Status: AC

## 2017-02-11 MED ORDER — ONDANSETRON HCL 4 MG/2ML IJ SOLN: 4.0000 mg | Freq: Four times a day (QID) | INTRAMUSCULAR | Status: DC | PRN

## 2017-02-11 MED ORDER — DEXTROSE 5 % IV SOLN: 500.0000 mg | INTRAVENOUS | Status: DC

## 2017-02-11 MED ORDER — ONDANSETRON HCL 4 MG PO TABS: 4.0000 mg | ORAL_TABLET | Freq: Four times a day (QID) | ORAL | Status: DC | PRN

## 2017-02-11 MED ORDER — LEVOFLOXACIN 750 MG PO TABS: 750.0000 mg | ORAL_TABLET | Freq: Every day | ORAL | 0 refills | Status: AC

## 2017-02-11 MED ORDER — ACETAMINOPHEN 325 MG PO TABS: 650.0000 mg | ORAL_TABLET | Freq: Four times a day (QID) | ORAL | Status: DC | PRN

## 2017-02-11 MED ORDER — LEVOFLOXACIN 750 MG PO TABS: 750.0000 mg | ORAL_TABLET | Freq: Every day | ORAL | 0 refills | Status: DC

## 2017-02-11 MED ORDER — LORAZEPAM 2 MG/ML IJ SOLN: 0.5000 mg | INTRAMUSCULAR | Status: DC | PRN

## 2017-02-11 MED ORDER — ACETAMINOPHEN 650 MG RE SUPP: 650.0000 mg | Freq: Four times a day (QID) | RECTAL | Status: DC | PRN

## 2017-02-11 NOTE — ED Notes (Signed)
Dr. Sheran Fava contacted with change of pharmacy for medication. States pt is leaving AMA. pTis alerts and oriented x 3 maew resp even and non labored. Sjkin warma dn dry.

## 2017-02-11 NOTE — ED Notes (Signed)
Pt state she wants tio leave. Will inform MD of same.

## 2017-02-11 NOTE — ED Notes (Signed)
Admitting md at bedside

## 2017-02-11 NOTE — Consult Note (Signed)
[facility]  Date of Admission:  02/10/2017  Date of Consult:  02/11/2017  Reason for Consult: AIDS Referring Physician: Alyson Ingles  Impression/Recommendation AIDS Thrush KS Neutropenia  Pt leaving AMA Would treat him for PCP with bactrim Does not need steroids by his ABG Treat his thrush Will have our nurse follow him up (states he lives in [place] and does not have transportation)  Comment I suspect he has KS with pulmonary involvement. He is at very high risk for poor outcome, I have explained this to him.  He needs ART and he needs bronch . Discussed with Dr Sheran Fava.   Thank you so much for this interesting consult,   Brandon Hernandez (pager) [phone] [email or web address]  Brandon Hernandez is an 36 y.o. male.  HPI: 37 yo M with hx of HIV + since 2008. He has been intermittently adherent with ART. He can't remember his last ART.  He has prev been cared for here as well as most recently in [place].  He comes to ED after having 4 episodes of hemoptysis on 9-18.  He denies f/c.  Denies TB exposures, incarceration, homelessness.  He has brusing on his LE which he attributes to a recent trauma.   Past Medical History:  Diagnosis Date  . AIDS ([place]) 06/26/2015  . Constipation 06/26/2015  . Dark urine 09/17/2015  . Elevated LFTs 08/13/2015  . FUO (fever of unknown origin) 06/26/2015  . HIV disease ([place])   . 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 PCP (pneumocystis carinii pneumonia) ([place]) 06/26/2015  . Rash and nonspecific skin eruption 08/13/2015  . Wart 09/17/2015    History reviewed. No pertinent surgical history.   No Known Allergies  Medications:  Scheduled: . nicotine  7 mg Transdermal Daily    Abtx:  Anti-infectives    Start     Dose/Rate Route Frequency Ordered Stop   02/11/17 2200  cefTRIAXone (ROCEPHIN) 1 g in dextrose 5 % 50 mL IVPB     1 g 100 mL/hr over 30 Minutes Intravenous Every 24  hours 02/11/17 0006 02/18/17 2159   02/11/17 2200  azithromycin (ZITHROMAX) 500 mg in dextrose 5 % 250 mL IVPB     500 mg 250 mL/hr over 60 Minutes Intravenous Every 24 hours 02/11/17 0006 02/18/17 2159   02/11/17 0830  sulfamethoxazole-trimethoprim (BACTRIM) 350 mg in dextrose 5 % 500 mL IVPB     350 mg 347.9 mL/hr over 90 Minutes Intravenous Every 8 hours 02/11/17 0821     02/11/17 0000  levofloxacin (LEVAQUIN) 750 MG tablet  Status:  Discontinued     750 mg Oral Daily 02/11/17 1342 02/11/17    02/11/17 0000  sulfamethoxazole-trimethoprim (BACTRIM DS,SEPTRA DS) 800-160 MG tablet  Status:  Discontinued     2 tablet Oral 4 times daily 02/11/17 1342 02/11/17    02/11/17 0000  sulfamethoxazole-trimethoprim (BACTRIM DS,SEPTRA DS) 800-160 MG tablet  Status:  Discontinued    Comments:  Replaces Rx for four times daily.   2 tablet Oral 3 times daily 02/11/17 1348 02/11/17    02/11/17 0000  sulfamethoxazole-trimethoprim (BACTRIM DS,SEPTRA DS) 800-160 MG tablet     2 tablet Oral 3 times daily 02/11/17 1400 03/04/17 2359   02/11/17 0000  levofloxacin (LEVAQUIN) 750 MG tablet     750 mg Oral Daily 02/11/17 1400 02/16/17 2359   02/11/17 0000  fluconazole (DIFLUCAN) 200 MG tablet        02/11/17 1428     02/10/17  2315  cefTRIAXone (ROCEPHIN) 1 g in dextrose 5 % 50 mL IVPB     1 g 100 mL/hr over 30 Minutes Intravenous  Once 02/10/17 2311 02/10/17 2358   02/10/17 2315  azithromycin (ZITHROMAX) 500 mg in dextrose 5 % 250 mL IVPB     500 mg 250 mL/hr over 60 Minutes Intravenous  Once 02/10/17 2311 02/11/17 0028      Total days of antibiotics: 0 ceftriaxone/bactrim          Social History:  reports that he has been smoking Cigarettes.  He started smoking about 17 years ago. He has been smoking about 0.33 packs per day. He has never used smokeless tobacco. He reports that he drinks alcohol. He reports that he does not use drugs.  Family History  Problem Relation Age of Onset  . Cancer Mother   .  Cancer Father   . Diabetes Sister     History obtained from the patient General ROS: no f/c, +oral sores, wt variable, +cough/hemoptysis, normal BM, normal urine, +bruising. Please see HPI. 12 point ROS o/w (-)  Blood pressure 109/66, pulse (!) 113, temperature 98.3 F (36.8 C), temperature source Oral, resp. rate (!) 24, weight 67.9 kg (149 lb 11.2 oz), SpO2 92 %. General appearance: alert, cooperative and no distress Eyes: negative findings: conjunctivae and sclerae normal and pupils equal, round, reactive to light and accomodation Throat: abnormal findings: thrush, erythema of palate.  Neck: no adenopathy and supple, symmetrical, trachea midline Lungs: diminished breath sounds anterior - bilateral Heart: regular rate and rhythm Abdomen: normal findings: bowel sounds normal and soft, non-tender Extremities: edema trace Skin: disseminated KS lesions.    Results for orders placed or performed during the hospital encounter of 02/10/17 (from the past 48 hour(s))  CBC with Differential     Status: Abnormal   Collection Time: 02/10/17  7:44 PM  Result Value Ref Range   WBC 1.7 (L) 4.0 - 10.5 K/uL   RBC 2.59 (L) 4.22 - 5.81 MIL/uL   Hemoglobin 8.1 (L) 13.0 - 17.0 g/dL   HCT 25.1 (L) 39.0 - 52.0 %   MCV 96.9 78.0 - 100.0 fL   MCH 31.3 26.0 - 34.0 pg   MCHC 32.3 30.0 - 36.0 g/dL   RDW 17.4 (H) 11.5 - 15.5 %   Platelets 197 150 - 400 K/uL   Neutrophils Relative % 60 %   Neutro Abs 1.0 (L) 1.7 - 7.7 K/uL   Lymphocytes Relative 25 %   Lymphs Abs 0.4 (L) 0.7 - 4.0 K/uL   Monocytes Relative 6 %   Monocytes Absolute 0.1 0.1 - 1.0 K/uL   Eosinophils Relative 7 %   Eosinophils Absolute 0.1 0.0 - 0.7 K/uL   Basophils Relative 2 %   Basophils Absolute 0.0 0.0 - 0.1 K/uL  Comprehensive metabolic panel     Status: Abnormal   Collection Time: 02/10/17  7:44 PM  Result Value Ref Range   Sodium 132 (L) 135 - 145 mmol/L   Potassium 4.4 3.5 - 5.1 mmol/L    Comment: SPECIMEN HEMOLYZED.  HEMOLYSIS MAY AFFECT INTEGRITY OF RESULTS.   Chloride 99 (L) 101 - 111 mmol/L   CO2 27 22 - 32 mmol/L   Glucose, Bld 84 65 - 99 mg/dL   BUN 9 6 - 20 mg/dL   Creatinine, Ser 0.90 0.61 - 1.24 mg/dL   Calcium 8.9 8.9 - 10.3 mg/dL   Total Protein 8.9 (H) 6.5 - 8.1 g/dL   Albumin 2.9 (L)  3.5 - 5.0 g/dL   AST 70 (H) 15 - 41 U/L   ALT 28 17 - 63 U/L   Alkaline Phosphatase 66 38 - 126 U/L   Total Bilirubin 0.6 0.3 - 1.2 mg/dL   GFR calc non Af Amer >60 >60 mL/min   GFR calc Af Amer >60 >60 mL/min    Comment: (NOTE) The eGFR has been calculated using the CKD EPI equation. This calculation has not been validated in all clinical situations. eGFR's persistently <60 mL/min signify possible Chronic Kidney Disease.    Anion gap 6 5 - 15  D-dimer, quantitative (not at [facility])     Status: Abnormal   Collection Time: 02/10/17  7:44 PM  Result Value Ref Range   D-Dimer, Quant 7.87 (H) 0.00 - 0.50 ug/mL-FEU    Comment: (NOTE) At the manufacturer cut-off of 0.50 ug/mL FEU, this assay has been documented to exclude PE with a sensitivity and negative predictive value of 97 to 99%.  At this time, this assay has not been approved by the FDA to exclude DVT/VTE. Results should be correlated with clinical presentation.   Culture, sputum-assessment     Status: None   Collection Time: 02/11/17 12:05 AM  Result Value Ref Range   Specimen Description EXPECTORATED SPUTUM    Special Requests NONE    Sputum evaluation THIS SPECIMEN IS ACCEPTABLE FOR SPUTUM CULTURE    Report Status 02/11/2017 FINAL   Culture, respiratory (NON-Expectorated)     Status: None (Preliminary result)   Collection Time: 02/11/17 12:05 AM  Result Value Ref Range   Specimen Description EXPECTORATED SPUTUM    Special Requests NONE Reflexed from [id number]    Gram Stain      MODERATE WBC PRESENT,BOTH PMN AND MONONUCLEAR RARE GRAM POSITIVE COCCI RARE GRAM POSITIVE RODS    Culture PENDING    Report Status PENDING   Basic metabolic panel      Status: Abnormal   Collection Time: 02/11/17  3:24 AM  Result Value Ref Range   Sodium 133 (L) 135 - 145 mmol/L   Potassium 4.0 3.5 - 5.1 mmol/L   Chloride 99 (L) 101 - 111 mmol/L   CO2 28 22 - 32 mmol/L   Glucose, Bld 92 65 - 99 mg/dL   BUN 8 6 - 20 mg/dL   Creatinine, Ser 0.81 0.61 - 1.24 mg/dL   Calcium 8.8 (L) 8.9 - 10.3 mg/dL   GFR calc non Af Amer >60 >60 mL/min   GFR calc Af Amer >60 >60 mL/min    Comment: (NOTE) The eGFR has been calculated using the CKD EPI equation. This calculation has not been validated in all clinical situations. eGFR's persistently <60 mL/min signify possible Chronic Kidney Disease.    Anion gap 6 5 - 15  CBC     Status: Abnormal   Collection Time: 02/11/17  3:24 AM  Result Value Ref Range   WBC 1.1 (LL) 4.0 - 10.5 K/uL    Comment: REPEATED TO VERIFY WHITE COUNT CONFIRMED ON SMEAR CRITICAL RESULT CALLED TO, READ BACK BY AND VERIFIED WITH: E.REABOLD,RN 0434 02/11/17 M.CAMPBELL    RBC 3.95 (L) 4.22 - 5.81 MIL/uL   Hemoglobin 12.4 (L) 13.0 - 17.0 g/dL    Comment: DELTA CHECK NOTED REPEATED TO VERIFY    HCT 37.9 (L) 39.0 - 52.0 %   MCV 95.9 78.0 - 100.0 fL   MCH 31.4 26.0 - 34.0 pg   MCHC 32.7 30.0 - 36.0 g/dL   RDW 17.1 (H) 11.5 -  15.5 %   Platelets 141 (L) 150 - 400 K/uL  Lactate dehydrogenase     Status: Abnormal   Collection Time: 02/11/17  3:28 AM  Result Value Ref Range   LDH 399 (H) 98 - 192 U/L  T-helper cells (CD4) count (not at [facility])     Status: Abnormal   Collection Time: 02/11/17  3:28 AM  Result Value Ref Range   CD4 T Cell Abs 10 (L) 400 - 2,700 /uL   CD4 % Helper T Cell 5 (L) 33 - 55 %    Comment: Performed at [facility], [place] [address] [place], [place] [address]  Strep pneumoniae urinary antigen     Status: None   Collection Time: 02/11/17  3:30 AM  Result Value Ref Range   Strep Pneumo Urinary Antigen NEGATIVE NEGATIVE    Comment:        Infection due to S. pneumoniae cannot be absolutely  ruled out since the antigen present may be below the detection limit of the test.   Vitamin B12     Status: Abnormal   Collection Time: 02/11/17  9:34 AM  Result Value Ref Range   Vitamin B-12 2,490 (H) 180 - 914 pg/mL    Comment: (NOTE) This assay is not validated for testing neonatal or myeloproliferative syndrome specimens for Vitamin B12 levels.   Folate     Status: None   Collection Time: 02/11/17  9:34 AM  Result Value Ref Range   Folate 11.6 >5.9 ng/mL  Iron and TIBC     Status: Abnormal   Collection Time: 02/11/17  9:34 AM  Result Value Ref Range   Iron 60 45 - 182 ug/dL   TIBC 144 (L) 250 - 450 ug/dL   Saturation Ratios 42 (H) 17.9 - 39.5 %   UIBC 84 ug/dL  Ferritin     Status: Abnormal   Collection Time: 02/11/17  9:34 AM  Result Value Ref Range   Ferritin 880 (H) 24 - 336 ng/mL  Reticulocytes     Status: Abnormal   Collection Time: 02/11/17  9:34 AM  Result Value Ref Range   Retic Ct Pct 3.1 0.4 - 3.1 %   RBC. 3.93 (L) 4.22 - 5.81 MIL/uL   Retic Count, Absolute 121.8 19.0 - 186.0 K/uL  I-Stat arterial blood gas, ED     Status: Abnormal   Collection Time: 02/11/17 11:01 AM  Result Value Ref Range   pH, Arterial 7.490 (H) 7.350 - 7.450   pCO2 arterial 36.4 32.0 - 48.0 mmHg   pO2, Arterial 73.0 (L) 83.0 - 108.0 mmHg   Bicarbonate 27.8 20.0 - 28.0 mmol/L   TCO2 29 22 - 32 mmol/L   O2 Saturation 96.0 %   Acid-Base Excess 4.0 (H) 0.0 - 2.0 mmol/L   Patient temperature HIDE    Sample type ARTERIAL   Urine rapid drug screen (hosp performed)     Status: Abnormal   Collection Time: 02/11/17  1:30 PM  Result Value Ref Range   Opiates NONE DETECTED NONE DETECTED   Cocaine NONE DETECTED NONE DETECTED   Benzodiazepines NONE DETECTED NONE DETECTED   Amphetamines NONE DETECTED NONE DETECTED   Tetrahydrocannabinol POSITIVE (A) NONE DETECTED   Barbiturates NONE DETECTED NONE DETECTED    Comment:        DRUG SCREEN FOR MEDICAL PURPOSES ONLY.  IF CONFIRMATION IS  NEEDED FOR ANY PURPOSE, NOTIFY LAB WITHIN 5 DAYS.        LOWEST DETECTABLE LIMITS  FOR URINE DRUG SCREEN Drug Class       Cutoff (ng/mL) Amphetamine      1000 Barbiturate      200 Benzodiazepine   053 Tricyclics       976 Opiates          300 Cocaine          300 THC              50       Component Value Date/Time   SDES EXPECTORATED SPUTUM 02/11/2017 0005   SDES EXPECTORATED SPUTUM 02/11/2017 0005   SPECREQUEST NONE 02/11/2017 0005   SPECREQUEST NONE Reflexed from [id number] 02/11/2017 0005   CULT PENDING 02/11/2017 0005   REPTSTATUS 02/11/2017 FINAL 02/11/2017 0005   REPTSTATUS PENDING 02/11/2017 0005   Dg Chest 2 View  Result Date: 02/10/2017 CLINICAL DATA:  Cough and hemoptysis.  Bilateral foot numbness. EXAM: CHEST  2 VIEW COMPARISON:  06/18/2015 FINDINGS: Normal heart size and pulmonary vascularity. There is peribronchial thickening with mild bronchiectasis and perihilar and basilar interstitial changes in the lungs. Findings demonstrate progression since previous study. This likely indicates chronic bronchitis or airways disease with superimposed acute bronchitis. Other interstitial pneumonia could also have this appearance. No focal consolidation. Blunting of the costophrenic angles is new since prior study suggesting small pleural effusions. No pneumothorax. Mediastinal contours appear intact. IMPRESSION: Acute on chronic bronchitic changes with evidence of acute peribronchial/ interstitial process. Electronically Signed   By: Lucienne Capers M.D.   On: 02/10/2017 21:06   Ct Angio Chest Pe W And/or Wo Contrast  Result Date: 02/10/2017 CLINICAL DATA:  Hemoptysis. Positive D-dimer. History of HIV, aids, and immune reconstitution inflammatory syndrome. EXAM: CT ANGIOGRAPHY CHEST WITH CONTRAST TECHNIQUE: Multidetector CT imaging of the chest was performed using the standard protocol during bolus administration of intravenous contrast. Multiplanar CT image reconstructions and MIPs  were obtained to evaluate the vascular anatomy. CONTRAST:  64 mL Isovue 370 COMPARISON:  03/14/2010 FINDINGS: Cardiovascular: Satisfactory opacification of the pulmonary arteries to the segmental level. No evidence of pulmonary embolism. Cardiac enlargement. Small pericardial effusion. Normal caliber thoracic aorta. No aneurysm or dissection. Great vessel origins are patent. Mediastinum/Nodes: Mediastinal lymphadenopathy is demonstrated with moderately enlarged nodes in the aortopulmonic window, left paratracheal, subcarinal, and pretracheal regions. Largest nodes in the left paratracheal region measure about 1.5 cm diameter. Given the history, these could represent inflammatory or reactive nodes. Lymphoproliferative disorder or metastasis not excluded. Lymph nodes have enlarged since previous study. Esophagus is decompressed. Lungs/Pleura: Small right pleural effusion. Patchy airspace infiltrative changes demonstrated diffusely throughout the right lung and in a perihilar distribution of the left lung. This may represent edema, multifocal pneumonia, atypical pneumonia, or other inflammatory process. Peribronchial thickening consistent with bronchitis. Airways are patent. No pneumothorax. Upper Abdomen: Limit evaluation. No acute abnormality demonstrated in the visualized portion. Musculoskeletal: No chest wall abnormality. No acute or significant osseous findings. Review of the MIP images confirms the above findings. IMPRESSION: 1. No evidence of significant pulmonary embolus. 2. Cardiac enlargement. 3. Nonspecific mediastinal lymphadenopathy. 4. Diffuse patchy airspace infiltrative changes in both lungs, more prominent on the right. Bronchial wall thickening. This may represent edema, multifocal pneumonia, atypical pneumonia, or other inflammatory process. Consider TB or fungal etiologies in an immune compromised patient. 5. Right pleural effusion.  Small pericardial effusion. Electronically Signed   By: Lucienne Capers M.D.   On: 02/10/2017 22:42   Recent Results (from the past 240 hour(s))  Culture, sputum-assessment  Status: None   Collection Time: 02/11/17 12:05 AM  Result Value Ref Range Status   Specimen Description EXPECTORATED SPUTUM  Final   Special Requests NONE  Final   Sputum evaluation THIS SPECIMEN IS ACCEPTABLE FOR SPUTUM CULTURE  Final   Report Status 02/11/2017 FINAL  Final  Culture, respiratory (NON-Expectorated)     Status: None (Preliminary result)   Collection Time: 02/11/17 12:05 AM  Result Value Ref Range Status   Specimen Description EXPECTORATED SPUTUM  Final   Special Requests NONE Reflexed from [id number]  Final   Gram Stain   Final    MODERATE WBC PRESENT,BOTH PMN AND MONONUCLEAR RARE GRAM POSITIVE COCCI RARE GRAM POSITIVE RODS    Culture PENDING  Incomplete   Report Status PENDING  Incomplete      02/11/2017, 2:30 PM     LOS: 1 day    Records and images were personally reviewed where available.  Brandon Rumpf, MD [facility] [phone] 02/11/2017, 2:30 PM

## 2017-02-11 NOTE — H&P (Signed)
History and Physical    Brandon Hernandez BWG:[id number] DOB: 08/08/1980 DOA: 02/10/2017  PCP: Patient, No Pcp Per  Patient coming from: Home.  Chief Complaint: Hemoptysis.  HPI: Brandon Hernandez is a 36 y.o. male with history of AIDS noncompliant with medications recently moved from [place], used to follow-up at [facility] in 2017 presents to ER with complaints of hemoptysis. Patient states he has had 4-5 episodes of coughing up blood yesterday. Denies any chest pain or shortness of breath fever or chills. Patient states he does not take his antiretrovirals due to its side effects.   ED Course: In the ER patient is not in respiratory distress. D-dimer was elevated. CT chest angiogram was negative for pulmonary embolism but does show bilateral infiltrates concerning for pneumonia versus pulmonary edema. Patient was started on empiric antibiotics for community-acquired pneumonia.  Review of Systems: As per HPI, rest all negative.   Past Medical History:  Diagnosis Date  . AIDS ([place]) 06/26/2015  . Constipation 06/26/2015  . Dark urine 09/17/2015  . Elevated LFTs 08/13/2015  . FUO (fever of unknown origin) 06/26/2015  . HIV disease ([place])   . 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 PCP (pneumocystis carinii pneumonia) ([place]) 06/26/2015  . Rash and nonspecific skin eruption 08/13/2015  . Wart 09/17/2015    History reviewed. No pertinent surgical history.   reports that he has been smoking Cigarettes.  He started smoking about 17 years ago. He has been smoking about 0.33 packs per day. He has never used smokeless tobacco. He reports that he drinks alcohol. He reports that he does not use drugs.  No Known Allergies  Family History  Problem Relation Age of Onset  . Cancer Mother   . Cancer Father   . Diabetes Sister     Prior to Admission medications   Medication Sig Start Date End Date Taking? Authorizing Provider  dolutegravir (TIVICAY) 50 MG  tablet Take 1 tablet (50 mg total) by mouth daily. Patient not taking: Reported on 02/10/2017 09/26/15   Tommy Medal, Lavell Islam, MD  emtricitabine-rilpivir-tenofovir AF (ODEFSEY) 200-25-25 MG TABS tablet Take 1 tablet by mouth daily with breakfast. Patient not taking: Reported on 02/10/2017 09/26/15   Tommy Medal, Lavell Islam, MD  sulfamethoxazole-trimethoprim (BACTRIM) 400-80 MG tablet Take 1 tablet by mouth daily. Patient not taking: Reported on 02/10/2017 09/26/15   Tommy Medal, Lavell Islam, MD    Physical Exam: Vitals:   02/10/17 1900 02/10/17 1930 02/10/17 2000 02/10/17 2030  BP: 107/73 102/75 104/75 107/75  Pulse:      Resp:      Temp:      TempSrc:      SpO2:          Constitutional: Moderately built and nourished. Vitals:   02/10/17 1900 02/10/17 1930 02/10/17 2000 02/10/17 2030  BP: 107/73 102/75 104/75 107/75  Pulse:      Resp:      Temp:      TempSrc:      SpO2:       Eyes: Anicteric mild pallor. ENMT: No discharge from the ears eyes nose and mouth. Neck: No mass felt. No JVD appreciated.  Respiratory: No rhonchi or crepitations. Cardiovascular: S1-S2 heard no murmurs appreciated. Abdomen: Soft nontender bowel sounds present. Musculoskeletal: No edema. No joint effusion. Skin: Multiple skin lesions. Neurologic: Alert awake oriented to time place and person. Moves all extremities. Psychiatric: Appears normal. Normal affect.   Labs on Admission: I have personally reviewed  following labs and imaging studies  CBC:  Recent Labs Lab 02/10/17 1944  WBC 1.7*  NEUTROABS 1.0*  HGB 8.1*  HCT 25.1*  MCV 96.9  PLT 782   Basic Metabolic Panel:  Recent Labs Lab 02/10/17 1944  NA 132*  K 4.4  CL 99*  CO2 27  GLUCOSE 84  BUN 9  CREATININE 0.90  CALCIUM 8.9   GFR: CrCl cannot be calculated (Unknown ideal weight.). Liver Function Tests:  Recent Labs Lab 02/10/17 1944  AST 70*  ALT 28  ALKPHOS 66  BILITOT 0.6  PROT 8.9*  ALBUMIN 2.9*   No results for input(s):  LIPASE, AMYLASE in the last 168 hours. No results for input(s): AMMONIA in the last 168 hours. Coagulation Profile: No results for input(s): INR, PROTIME in the last 168 hours. Cardiac Enzymes: No results for input(s): CKTOTAL, CKMB, CKMBINDEX, TROPONINI in the last 168 hours. BNP (last 3 results) No results for input(s): PROBNP in the last 8760 hours. HbA1C: No results for input(s): HGBA1C in the last 72 hours. CBG: No results for input(s): GLUCAP in the last 168 hours. Lipid Profile: No results for input(s): CHOL, HDL, LDLCALC, TRIG, CHOLHDL, LDLDIRECT in the last 72 hours. Thyroid Function Tests: No results for input(s): TSH, T4TOTAL, FREET4, T3FREE, THYROIDAB in the last 72 hours. Anemia Panel: No results for input(s): VITAMINB12, FOLATE, FERRITIN, TIBC, IRON, RETICCTPCT in the last 72 hours. Urine analysis:    Component Value Date/Time   COLORURINE YELLOW 07/10/2015 1003   APPEARANCEUR CLEAR 07/10/2015 1003   LABSPEC 1.014 07/10/2015 1003   PHURINE 7.0 07/10/2015 1003   GLUCOSEU NEGATIVE 07/10/2015 1003   HGBUR NEGATIVE 07/10/2015 1003   BILIRUBINUR NEGATIVE 07/10/2015 1003   [place] 07/10/2015 1003   PROTEINUR NEGATIVE 07/10/2015 1003   NITRITE POSITIVE (A) 07/10/2015 1003   LEUKOCYTESUR 3+ (A) 07/10/2015 1003   Sepsis Labs: @LABRCNTIP (procalcitonin:4,lacticidven:4) )No results found for this or any previous visit (from the past 240 hour(s)).   Radiological Exams on Admission: Dg Chest 2 View  Result Date: 02/10/2017 CLINICAL DATA:  Cough and hemoptysis.  Bilateral foot numbness. EXAM: CHEST  2 VIEW COMPARISON:  06/18/2015 FINDINGS: Normal heart size and pulmonary vascularity. There is peribronchial thickening with mild bronchiectasis and perihilar and basilar interstitial changes in the lungs. Findings demonstrate progression since previous study. This likely indicates chronic bronchitis or airways disease with superimposed acute bronchitis. Other  interstitial pneumonia could also have this appearance. No focal consolidation. Blunting of the costophrenic angles is new since prior study suggesting small pleural effusions. No pneumothorax. Mediastinal contours appear intact. IMPRESSION: Acute on chronic bronchitic changes with evidence of acute peribronchial/ interstitial process. Electronically Signed   By: Lucienne Capers M.D.   On: 02/10/2017 21:06   Ct Angio Chest Pe W And/or Wo Contrast  Result Date: 02/10/2017 CLINICAL DATA:  Hemoptysis. Positive D-dimer. History of HIV, aids, and immune reconstitution inflammatory syndrome. EXAM: CT ANGIOGRAPHY CHEST WITH CONTRAST TECHNIQUE: Multidetector CT imaging of the chest was performed using the standard protocol during bolus administration of intravenous contrast. Multiplanar CT image reconstructions and MIPs were obtained to evaluate the vascular anatomy. CONTRAST:  64 mL Isovue 370 COMPARISON:  03/14/2010 FINDINGS: Cardiovascular: Satisfactory opacification of the pulmonary arteries to the segmental level. No evidence of pulmonary embolism. Cardiac enlargement. Small pericardial effusion. Normal caliber thoracic aorta. No aneurysm or dissection. Great vessel origins are patent. Mediastinum/Nodes: Mediastinal lymphadenopathy is demonstrated with moderately enlarged nodes in the aortopulmonic window, left paratracheal, subcarinal, and pretracheal regions. Largest  nodes in the left paratracheal region measure about 1.5 cm diameter. Given the history, these could represent inflammatory or reactive nodes. Lymphoproliferative disorder or metastasis not excluded. Lymph nodes have enlarged since previous study. Esophagus is decompressed. Lungs/Pleura: Small right pleural effusion. Patchy airspace infiltrative changes demonstrated diffusely throughout the right lung and in a perihilar distribution of the left lung. This may represent edema, multifocal pneumonia, atypical pneumonia, or other inflammatory process.  Peribronchial thickening consistent with bronchitis. Airways are patent. No pneumothorax. Upper Abdomen: Limit evaluation. No acute abnormality demonstrated in the visualized portion. Musculoskeletal: No chest wall abnormality. No acute or significant osseous findings. Review of the MIP images confirms the above findings. IMPRESSION: 1. No evidence of significant pulmonary embolus. 2. Cardiac enlargement. 3. Nonspecific mediastinal lymphadenopathy. 4. Diffuse patchy airspace infiltrative changes in both lungs, more prominent on the right. Bronchial wall thickening. This may represent edema, multifocal pneumonia, atypical pneumonia, or other inflammatory process. Consider TB or fungal etiologies in an immune compromised patient. 5. Right pleural effusion.  Small pericardial effusion. Electronically Signed   By: Lucienne Capers M.D.   On: 02/10/2017 22:42    EKG: Independently reviewed. Normal sinus rhythm with early repolarization changes.  Assessment/Plan Active Problems:   AIDS (acquired immune deficiency syndrome) (HCC)   Acute respiratory failure with hypoxia (HCC)   Normocytic normochromic anemia   CAP (community acquired pneumonia)    1. Pneumonia with hemoptysis - CT scan shows features concerning for pneumonia versus CHF and other differentials include tuberculosis. Patient is placed on ceftriaxone and Zithromax for coming to acquired pneumonia. Patient will be placed on a board precautions and will check sputum for AFB/Quantiferron gold assay. Patient probably may need prolonged. Will check LDH is elevated will keep patient on Bactrim for PCP. Patient at this time not hypoxic. 2. AIDS - check CD4 count and viral load. Consult infectious disease. 3. Cardiomegaly with pleural effusion - check 2-D echo. 4. Anemia probably related to AIDS -check anemia panel follow CBC. 5. Multiple skin lesions likely Kaposi's.    DVT prophylaxis: SCDs.  Code Status: Full code.  Family Communication:  Discuss with patient.  Disposition Plan: To be determined.  Consults called: None.  Admission status: Inpatient.    Rise Patience MD [facility] Pager [phone].  If 7PM-7AM, please contact night-coverage [email or web address] Password [email or web address]  02/11/2017, 12:07 AM

## 2017-02-11 NOTE — ED Notes (Signed)
admittinmg MD at bedsdide

## 2017-02-11 NOTE — Discharge Summary (Addendum)
Physician Discharge Summary  Brandon Hernandez QMG:[id number] DOB: 04-01-1981 DOA: 02/10/2017  PCP: Patient, No Pcp Per  Admit date: 02/10/2017 Left AMA on: 02/11/2017  Admitted From: living with sister Disposition:  same  Recommendations for Outpatient Follow-up:  1. Follow up with Infectious Disease ASAP 2. Given Rx for levofloxacin and bactrim to cover CAP and PCP pneumonia 3. AFB from sputum, Quantiferon gold, and HIV quant pending at time of discharge  CODE STATUS:  Full code   Diet recommendation:  regular   Brief/Interim Summary:  The patient is a 36 year old male with history of AIDS, noncompliant with medications, who recently moved from [place] and is staying with his sister in [place]. He presented to the emergency department after he coughed up blood about 4-5 times the day prior to admission. He denied associated shortness of breath, cough, chest pains, fevers or chills. In the emergency department, his d-dimer was elevated and follow-up CT angiogram of the chest was negative for pulmonary embolism but did show bilateral infiltrates concerning for atypical pneumonia versus pulmonary edema. He was started on treatment for community-acquired pneumonia PCP pneumonia.  His oxygen levels remained in the 90s on room air. His ABG demonstrated a PaO2 of greater than 70 mmHg and his AA gradient was 33, less than the cutoff of 35 for initiation of prednisone for treatment of his PCP.  Quantiferon gold was sent and AFB from sputum.  The patient was tachycardic to the 110s-120s in the ER. He stated he felt "fine" and could not be convinced to stay despite discussing risk of acute respiratory failure/distress, pulmonary hemorrhage, and death.    Discharge Diagnoses:  Active Problems:   AIDS (acquired immune deficiency syndrome) (HCC)   Acute respiratory failure with hypoxia (HCC)   Normocytic normochromic anemia   CAP (community acquired pneumonia)  Hemoptysis with CT suggestive of CAP,  TB, or PCP pneumonia.  Doubt CHF.  Ddx also includes pulmonary Kaposi sarcoma.  -  F/u sputum AFB -  F/u quantiferon gold  -  Levofloxacin 750mg  daily x 5 days -  Bactrim DS 2 tabs TID x 21 days -  ID clinic to call patient to assist with arranging outpatient bronchoscopy and initiation of treatment for his AIDS  AIDS, hx of PCP pneumonia, thrush, IRIS, Kaposi sarcoma, noncompliant with therapy Lab Results  Component Value Date   CD4TCELL 5 (L) 02/11/2017   CD4TABS 10 (L) 02/11/2017  -  HIV viral load pending -  ID to try to talk to patient about starting ART again -  Patient believes he is hypersensitive to ART and does better on an herbal regimen  Thrush -  Fluconazole 400mg  once followed by 200mg  daily thereafter  Cardiomegaly with pleural effusion -  ECHO not obtained prior to discharge  Pancytopenia likely related to AIDS  Kaposi's sarcoma skin lesions -  May also have pulmonary involvement, see above -  To start ART ASAP -  May need additional chemotherapy if pulmonary involvement confirmed  Discharge Instructions     Medication List    STOP taking these medications   dolutegravir 50 MG tablet Commonly known as:  TIVICAY   emtricitabine-rilpivir-tenofovir AF 200-25-25 MG Tabs tablet Commonly known as:  ODEFSEY   sulfamethoxazole-trimethoprim 400-80 MG tablet Commonly known as:  BACTRIM Replaced by:  sulfamethoxazole-trimethoprim 800-160 MG tablet     TAKE these medications   fluconazole 200 MG tablet Commonly known as:  DIFLUCAN Take two tabs on day 1 and 1 tab daily thereafter until all  tabs are gone   levofloxacin 750 MG tablet Commonly known as:  LEVAQUIN Take 1 tablet (750 mg total) by mouth daily.   sulfamethoxazole-trimethoprim 800-160 MG tablet Commonly known as:  BACTRIM DS,SEPTRA DS Take 2 tablets by mouth 3 (three) times daily. Replaces:  sulfamethoxazole-trimethoprim 400-80 MG tablet       No Known  Allergies  Consultations: ID  Procedures/Studies: Dg Chest 2 View  Result Date: 02/10/2017 CLINICAL DATA:  Cough and hemoptysis.  Bilateral foot numbness. EXAM: CHEST  2 VIEW COMPARISON:  06/18/2015 FINDINGS: Normal heart size and pulmonary vascularity. There is peribronchial thickening with mild bronchiectasis and perihilar and basilar interstitial changes in the lungs. Findings demonstrate progression since previous study. This likely indicates chronic bronchitis or airways disease with superimposed acute bronchitis. Other interstitial pneumonia could also have this appearance. No focal consolidation. Blunting of the costophrenic angles is new since prior study suggesting small pleural effusions. No pneumothorax. Mediastinal contours appear intact. IMPRESSION: Acute on chronic bronchitic changes with evidence of acute peribronchial/ interstitial process. Electronically Signed   By: Lucienne Capers M.D.   On: 02/10/2017 21:06   Ct Angio Chest Pe W And/or Wo Contrast  Result Date: 02/10/2017 CLINICAL DATA:  Hemoptysis. Positive D-dimer. History of HIV, aids, and immune reconstitution inflammatory syndrome. EXAM: CT ANGIOGRAPHY CHEST WITH CONTRAST TECHNIQUE: Multidetector CT imaging of the chest was performed using the standard protocol during bolus administration of intravenous contrast. Multiplanar CT image reconstructions and MIPs were obtained to evaluate the vascular anatomy. CONTRAST:  64 mL Isovue 370 COMPARISON:  03/14/2010 FINDINGS: Cardiovascular: Satisfactory opacification of the pulmonary arteries to the segmental level. No evidence of pulmonary embolism. Cardiac enlargement. Small pericardial effusion. Normal caliber thoracic aorta. No aneurysm or dissection. Great vessel origins are patent. Mediastinum/Nodes: Mediastinal lymphadenopathy is demonstrated with moderately enlarged nodes in the aortopulmonic window, left paratracheal, subcarinal, and pretracheal regions. Largest nodes in the  left paratracheal region measure about 1.5 cm diameter. Given the history, these could represent inflammatory or reactive nodes. Lymphoproliferative disorder or metastasis not excluded. Lymph nodes have enlarged since previous study. Esophagus is decompressed. Lungs/Pleura: Small right pleural effusion. Patchy airspace infiltrative changes demonstrated diffusely throughout the right lung and in a perihilar distribution of the left lung. This may represent edema, multifocal pneumonia, atypical pneumonia, or other inflammatory process. Peribronchial thickening consistent with bronchitis. Airways are patent. No pneumothorax. Upper Abdomen: Limit evaluation. No acute abnormality demonstrated in the visualized portion. Musculoskeletal: No chest wall abnormality. No acute or significant osseous findings. Review of the MIP images confirms the above findings. IMPRESSION: 1. No evidence of significant pulmonary embolus. 2. Cardiac enlargement. 3. Nonspecific mediastinal lymphadenopathy. 4. Diffuse patchy airspace infiltrative changes in both lungs, more prominent on the right. Bronchial wall thickening. This may represent edema, multifocal pneumonia, atypical pneumonia, or other inflammatory process. Consider TB or fungal etiologies in an immune compromised patient. 5. Right pleural effusion.  Small pericardial effusion. Electronically Signed   By: Lucienne Capers M.D.   On: 02/10/2017 22:42    Subjective:  Denies cough, SOB.  No further hemoptysis since coming to the ER.  Denies fevers, chills, DOE.    Discharge Exam: Vitals:   02/11/17 1315 02/11/17 1450  BP:  106/64  Pulse: (!) 113 (!) 113  Resp: (!) 24 16  Temp:  98.9 F (37.2 C)  SpO2: 92% 99%   Vitals:   02/11/17 1230 02/11/17 1300 02/11/17 1315 02/11/17 1450  BP: 106/69 109/66  106/64  Pulse: (!) 113 (!) 112 (!)  113 (!) 113  Resp: 15 (!) 25 (!) 24 16  Temp:    98.9 F (37.2 C)  TempSrc:    Oral  SpO2: 96% 94% 92% 99%  Weight:         General: Pt is alert, awake, not in acute distress Cardiovascular: tachycardic, regular rhythm, no rubs, no gallops Respiratory:  Rales, rhonchi, and egophany present at bases.   Abdominal: Soft, NT, ND, bowel sounds + Extremities: no edema, no cyanosis Derm:  Multiple ecchymotic-like plaques on back and extremities c/w Kaposi sarcoma    The results of significant diagnostics from this hospitalization (including imaging, microbiology, ancillary and laboratory) are listed below for reference.     Microbiology: Recent Results (from the past 240 hour(s))  Culture, sputum-assessment     Status: None   Collection Time: 02/11/17 12:05 AM  Result Value Ref Range Status   Specimen Description EXPECTORATED SPUTUM  Final   Special Requests NONE  Final   Sputum evaluation THIS SPECIMEN IS ACCEPTABLE FOR SPUTUM CULTURE  Final   Report Status 02/11/2017 FINAL  Final  Culture, respiratory (NON-Expectorated)     Status: None (Preliminary result)   Collection Time: 02/11/17 12:05 AM  Result Value Ref Range Status   Specimen Description EXPECTORATED SPUTUM  Final   Special Requests NONE Reflexed from [id number]  Final   Gram Stain   Final    MODERATE WBC PRESENT,BOTH PMN AND MONONUCLEAR RARE GRAM POSITIVE COCCI RARE GRAM POSITIVE RODS    Culture PENDING  Incomplete   Report Status PENDING  Incomplete     Labs: BNP (last 3 results) No results for input(s): BNP in the last 8760 hours. Basic Metabolic Panel:  Recent Labs Lab 02/10/17 1944 02/11/17 0324  NA 132* 133*  K 4.4 4.0  CL 99* 99*  CO2 27 28  GLUCOSE 84 92  BUN 9 8  CREATININE 0.90 0.81  CALCIUM 8.9 8.8*   Liver Function Tests:  Recent Labs Lab 02/10/17 1944  AST 70*  ALT 28  ALKPHOS 66  BILITOT 0.6  PROT 8.9*  ALBUMIN 2.9*   No results for input(s): LIPASE, AMYLASE in the last 168 hours. No results for input(s): AMMONIA in the last 168 hours. CBC:  Recent Labs Lab 02/10/17 1944 02/11/17 0324  WBC 1.7*  1.1*  NEUTROABS 1.0*  --   HGB 8.1* 12.4*  HCT 25.1* 37.9*  MCV 96.9 95.9  PLT 197 141*   Cardiac Enzymes: No results for input(s): CKTOTAL, CKMB, CKMBINDEX, TROPONINI in the last 168 hours. BNP: Invalid input(s): POCBNP CBG: No results for input(s): GLUCAP in the last 168 hours. D-Dimer  Recent Labs  02/10/17 1944  DDIMER 7.87*   Hgb A1c No results for input(s): HGBA1C in the last 72 hours. Lipid Profile No results for input(s): CHOL, HDL, LDLCALC, TRIG, CHOLHDL, LDLDIRECT in the last 72 hours. Thyroid function studies No results for input(s): TSH, T4TOTAL, T3FREE, THYROIDAB in the last 72 hours.  Invalid input(s): FREET3 Anemia work up  Recent Labs  02/11/17 0934  VITAMINB12 2,490*  FOLATE 11.6  FERRITIN 880*  TIBC 144*  IRON 60  RETICCTPCT 3.1   Urinalysis    Component Value Date/Time   COLORURINE YELLOW 07/10/2015 1003   APPEARANCEUR CLEAR 07/10/2015 1003   LABSPEC 1.014 07/10/2015 1003   PHURINE 7.0 07/10/2015 1003   GLUCOSEU NEGATIVE 07/10/2015 1003   HGBUR NEGATIVE 07/10/2015 1003   BILIRUBINUR NEGATIVE 07/10/2015 1003   [place] 07/10/2015 1003   PROTEINUR NEGATIVE 07/10/2015 1003  NITRITE POSITIVE (A) 07/10/2015 1003   LEUKOCYTESUR 3+ (A) 07/10/2015 1003   Sepsis Labs Invalid input(s): PROCALCITONIN,  WBC,  LACTICIDVEN   Time coordinating discharge: Over 30 minutes  SIGNED:   Janece Canterbury, MD  [facility] 02/11/2017, 7:08 PM Pager   If 7PM-7AM, please contact night-coverage [email or web address] Password [email or web address]

## 2017-02-11 NOTE — Progress Notes (Signed)
Pharmacy Antibiotic Note  Brandon Hernandez is a 36 y.o. male admitted on 02/10/2017 with r/o PCP pneumonia.  Pharmacy has been consulted for Bactrim IV dosing. Noncompliant with HIV medications PTA. Also on ceftriaxone/azithromycin per MD for PNA. Afebrile, WBC low 1.1. SCr wnl stable. Estimated weight in the ED ~150-160lbs per discussion with RN.  Plan: Bactrim 350mg  (~66m/kg) IV q8h Monitor clinical progress, c/s, renal function F/u de-escalation plan/LOT, vancomycin trough as indicated      Temp (24hrs), Avg:98.3 F (36.8 C), Min:98.3 F (36.8 C), Max:98.3 F (36.8 C)   Recent Labs Lab 02/10/17 1944 02/11/17 0324  WBC 1.7* 1.1*  CREATININE 0.90 0.81    CrCl cannot be calculated (Unknown ideal weight.).    No Known Allergies  Elicia Lamp, PharmD, BCPS Clinical Pharmacist Rx Phone # for today: [phone] After 3:30PM, please call Main Rx: [phone] 02/11/2017 7:31 AM

## 2017-02-11 NOTE — ED Notes (Signed)
MD at bedside. 

## 2017-02-11 NOTE — ED Notes (Signed)
AMA discussed with pt . Pt informed bed had been assigned but still chooses to leave. Pt also urged to return at any time for further care. Pt also informed that his leaving could result in his death.

## 2017-02-11 NOTE — ED Notes (Signed)
Patient is resting with call bell in reach  

## 2017-02-12 LAB — HIV-1 RNA QUANT-NO REFLEX-BLD
HIV 1 RNA Quant: 1020000 copies/mL
LOG10 HIV-1 RNA: 6.009 log10copy/mL

## 2017-02-12 LAB — ACID FAST SMEAR (AFB, MYCOBACTERIA)

## 2017-02-12 LAB — ACID FAST SMEAR (AFB): ACID FAST SMEAR - AFSCU2: NEGATIVE

## 2017-02-12 LAB — LEGIONELLA PNEUMOPHILA SEROGP 1 UR AG: L. PNEUMOPHILA SEROGP 1 UR AG: NEGATIVE

## 2017-02-13 ENCOUNTER — Telehealth: Payer: Self-pay | Admitting: *Deleted

## 2017-02-13 LAB — CULTURE, RESPIRATORY: CULTURE: NORMAL

## 2017-02-13 LAB — CULTURE, RESPIRATORY W GRAM STAIN

## 2017-02-13 NOTE — Telephone Encounter (Signed)
Referral received. Contacted Brandon Hernandez, left a message and then received a call back. After introducing myself he remembered our hospital visit during his Feb 2017 to offer services and connect with him. I explained to Brandon Hernandez that I really connected with him and Dr Johnnye Sima and I would love to see him back at the clinic and on medications. Explained to him that Dr Johnnye Sima is sincerely concerned for his well being.  Elic verbalized understanding and stating he is considering taking the medications but does not understand why he should since the medications make you just as sick as the virus. I did not argue that claim but suggested a different way of looking at the situation. I explained to Brandon Hernandez that the symptoms of the virus continues to break down your immune system until you can no longer fight infections but on the other hand the medications does have side effects while fighting the infection so you immune system can begin to fight for you. Reiterated to him that the virus is continuously breaking him down while the medication is building him up. He verbalized understanding and stated he has been thinking about taking the medication but is not ready. I explained to him that when he is ready to please give me a call.  I will be willing to do all I can to get the medication to his front door. He verbalized understanding and thanked me for the call.

## 2017-02-15 LAB — QUANTIFERON IN TUBE
QFT TB AG MINUS NIL VALUE: 0.1 [IU]/mL
QUANTIFERON MITOGEN VALUE: 1.7 IU/mL
QUANTIFERON TB AG VALUE: 1.11 IU/mL
QUANTIFERON TB GOLD: NEGATIVE
Quantiferon Nil Value: 1.01 IU/mL

## 2017-02-15 LAB — QUANTIFERON TB GOLD ASSAY (BLOOD)

## 2017-02-16 ENCOUNTER — Encounter: Payer: Self-pay | Admitting: *Deleted

## 2017-02-16 NOTE — Telephone Encounter (Signed)
Opened in ERROR

## 2017-03-26 LAB — ACID FAST CULTURE WITH REFLEXED SENSITIVITIES (MYCOBACTERIA): Acid Fast Culture: NEGATIVE

## 2017-07-23 DIAGNOSIS — R05 Cough: Secondary | ICD-10-CM | POA: Diagnosis not present

## 2017-07-23 DIAGNOSIS — B20 Human immunodeficiency virus [HIV] disease: Secondary | ICD-10-CM | POA: Diagnosis not present

## 2017-07-23 DIAGNOSIS — R0602 Shortness of breath: Secondary | ICD-10-CM | POA: Diagnosis not present

## 2017-07-23 DIAGNOSIS — J9610 Chronic respiratory failure, unspecified whether with hypoxia or hypercapnia: Secondary | ICD-10-CM | POA: Diagnosis not present

## 2017-09-08 IMAGING — CT CT HEAD W/O CM
2 series · 16 of 30 positions shown, 20 images · non-contrast
Comparison: None.

CLINICAL DATA: Headaches and photophobia, worse when standing.
History of HIV. 30 pound weight loss.

EXAM:
CT HEAD WITHOUT CONTRAST
TECHNIQUE: Contiguous axial images were obtained from the base of the skull
through the vertex without intravenous contrast.

[Series 201: head w/o, idose (1) · axial · non-contrast · 0.49mm/px · z∈[+128,+258]mm · 13 of 32 slices shown, 17 images]
[im 3/32  brain]
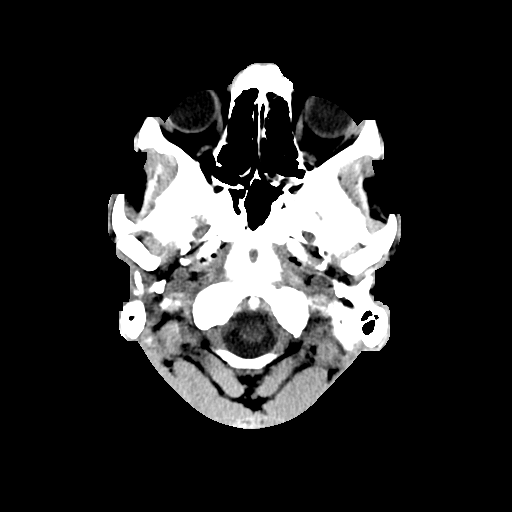
[im 3/32  bone]
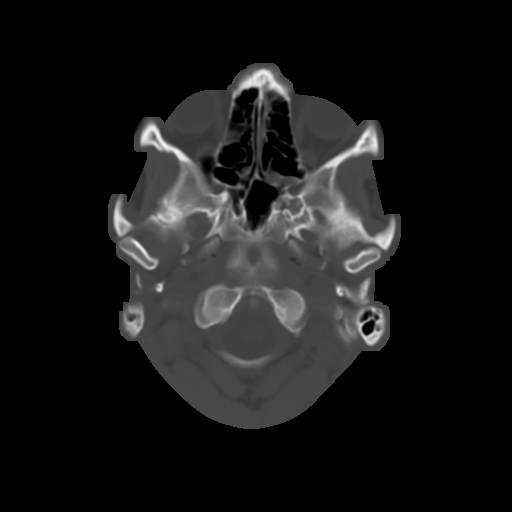
[im 5/32  brain]
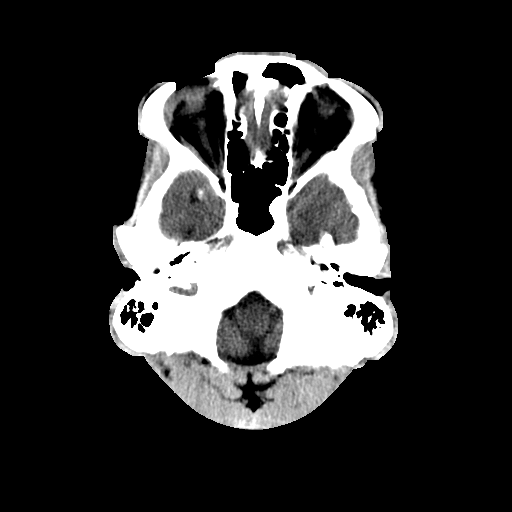
[im 7/32  brain]
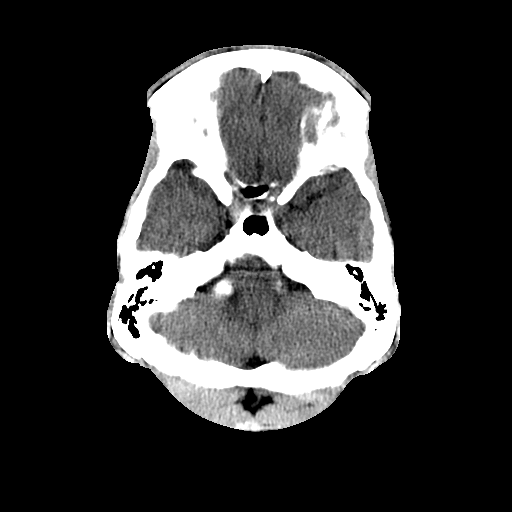
[im 9/32  brain]
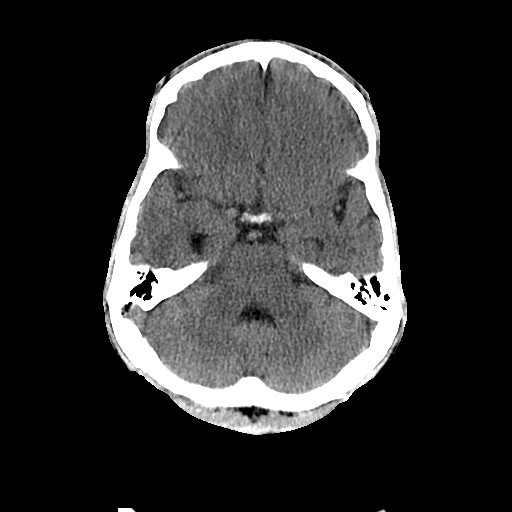
[im 12/32  brain]
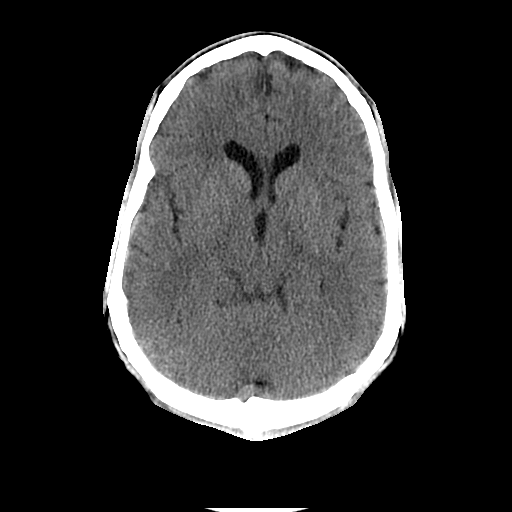
[im 12/32  bone]
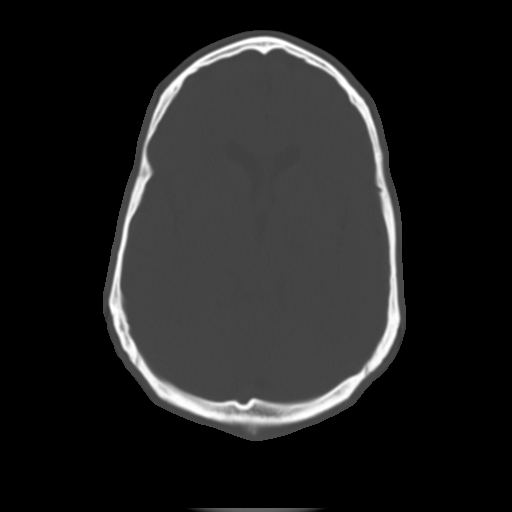
[im 14/32  brain]
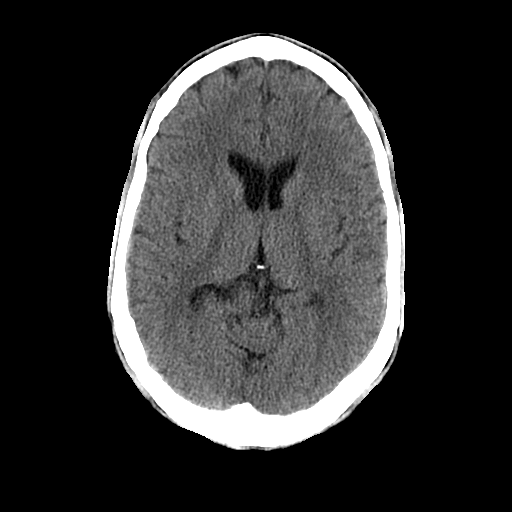
[im 16/32  brain]
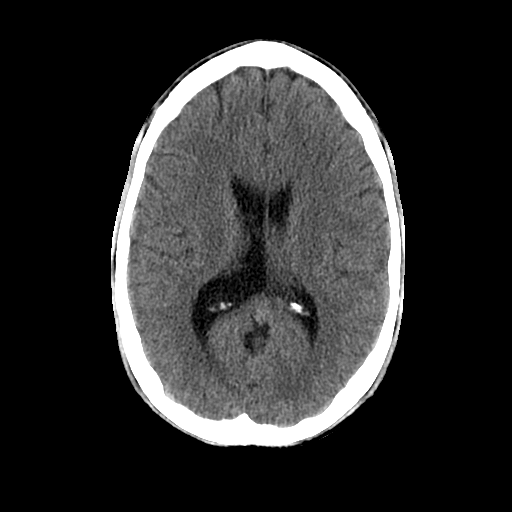
[im 18/32  brain]
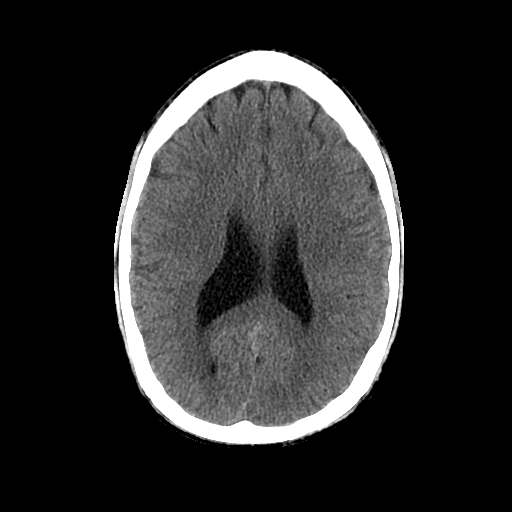
[im 20/32  brain]
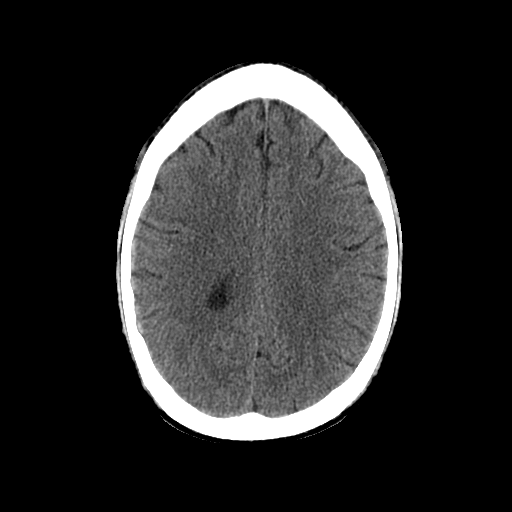
[im 20/32  bone]
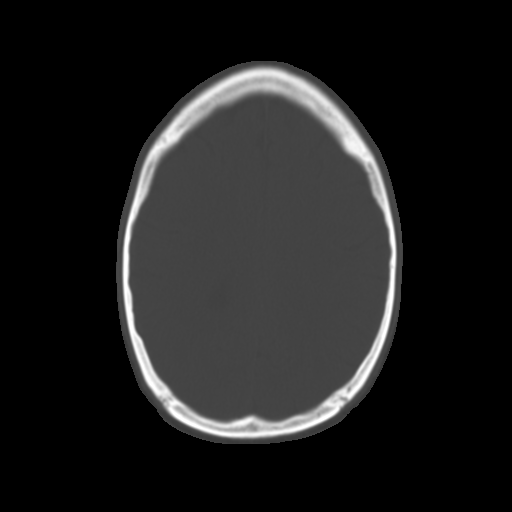
[im 23/32  brain]
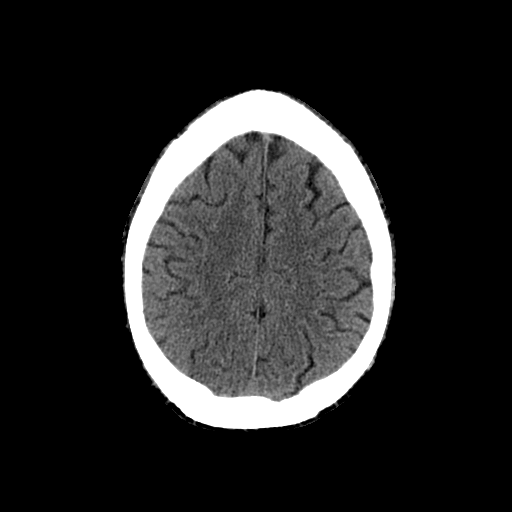
[im 25/32  brain]
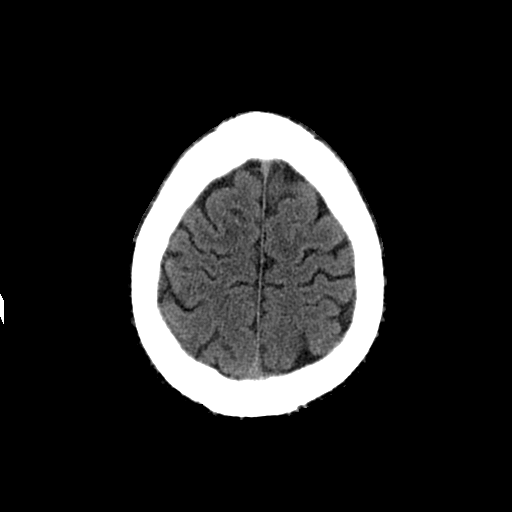
[im 27/32  brain]
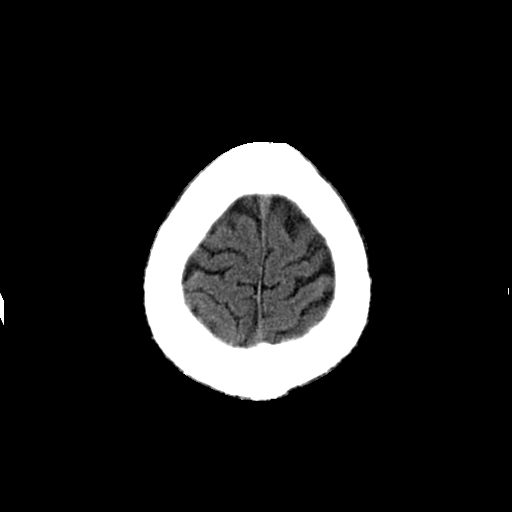
[im 29/32  brain]
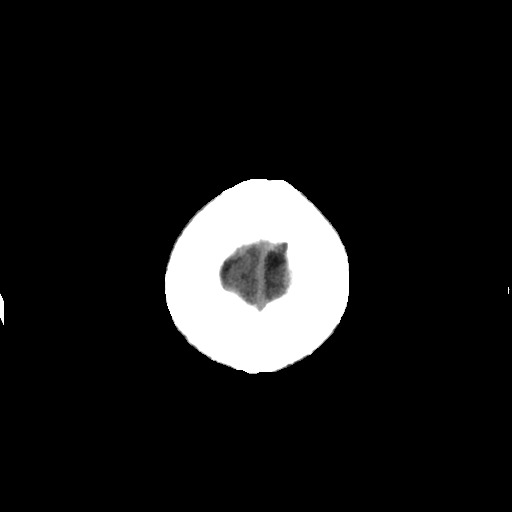
[im 29/32  bone]
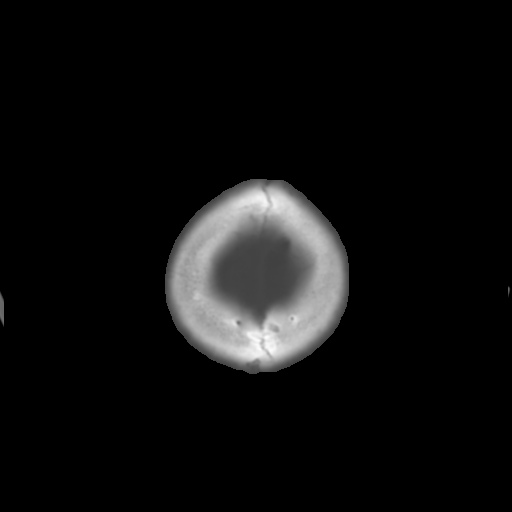

[Series 202: head w/o bone, idose (1) · axial · non-contrast · 0.49mm/px · z∈[+128,+173]mm · 3 of 32 slices shown]
[im 3/32  bone]
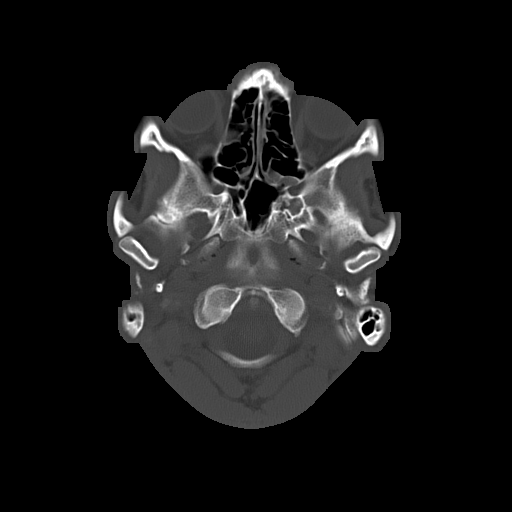
[im 7/32  bone]
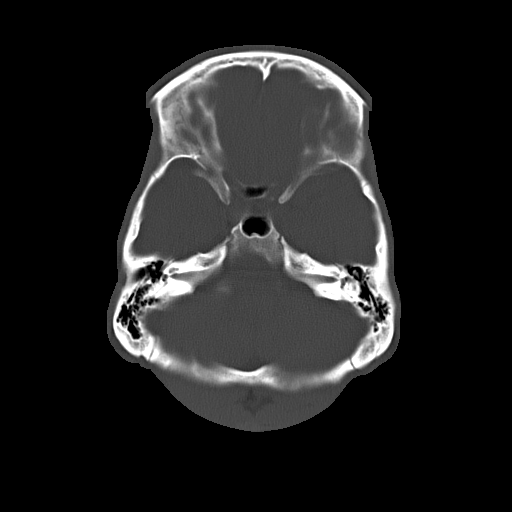
[im 12/32  bone]
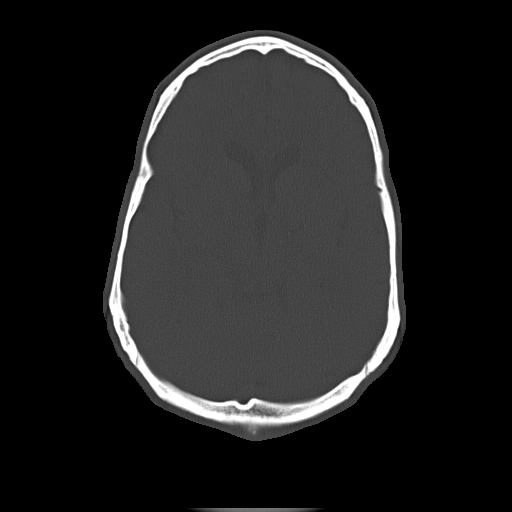

[16 of 30 positions shown; findings below may reference images not displayed]

FINDINGS: Ventricles and sulci appear symmetrical. No mass effect or midline
shift. No abnormal extra-axial fluid collections. Gray-white matter
junctions are distinct. Basal cisterns are not effaced. No evidence
of acute intracranial hemorrhage. No depressed skull fractures.
Opacification of bilateral ethmoid air cells. Mastoid air cells are
not opacified.
IMPRESSION: No acute intracranial abnormalities. Opacification of scattered
bilateral ethmoid air cells.

## 2019-05-04 IMAGING — CR DG CHEST 2V
2 series · 2 of 2 positions shown · non-contrast
Comparison: 06/18/2015

CLINICAL DATA: Cough and hemoptysis.  Bilateral foot numbness.

EXAM:
CHEST  2 VIEW

[chest pa]
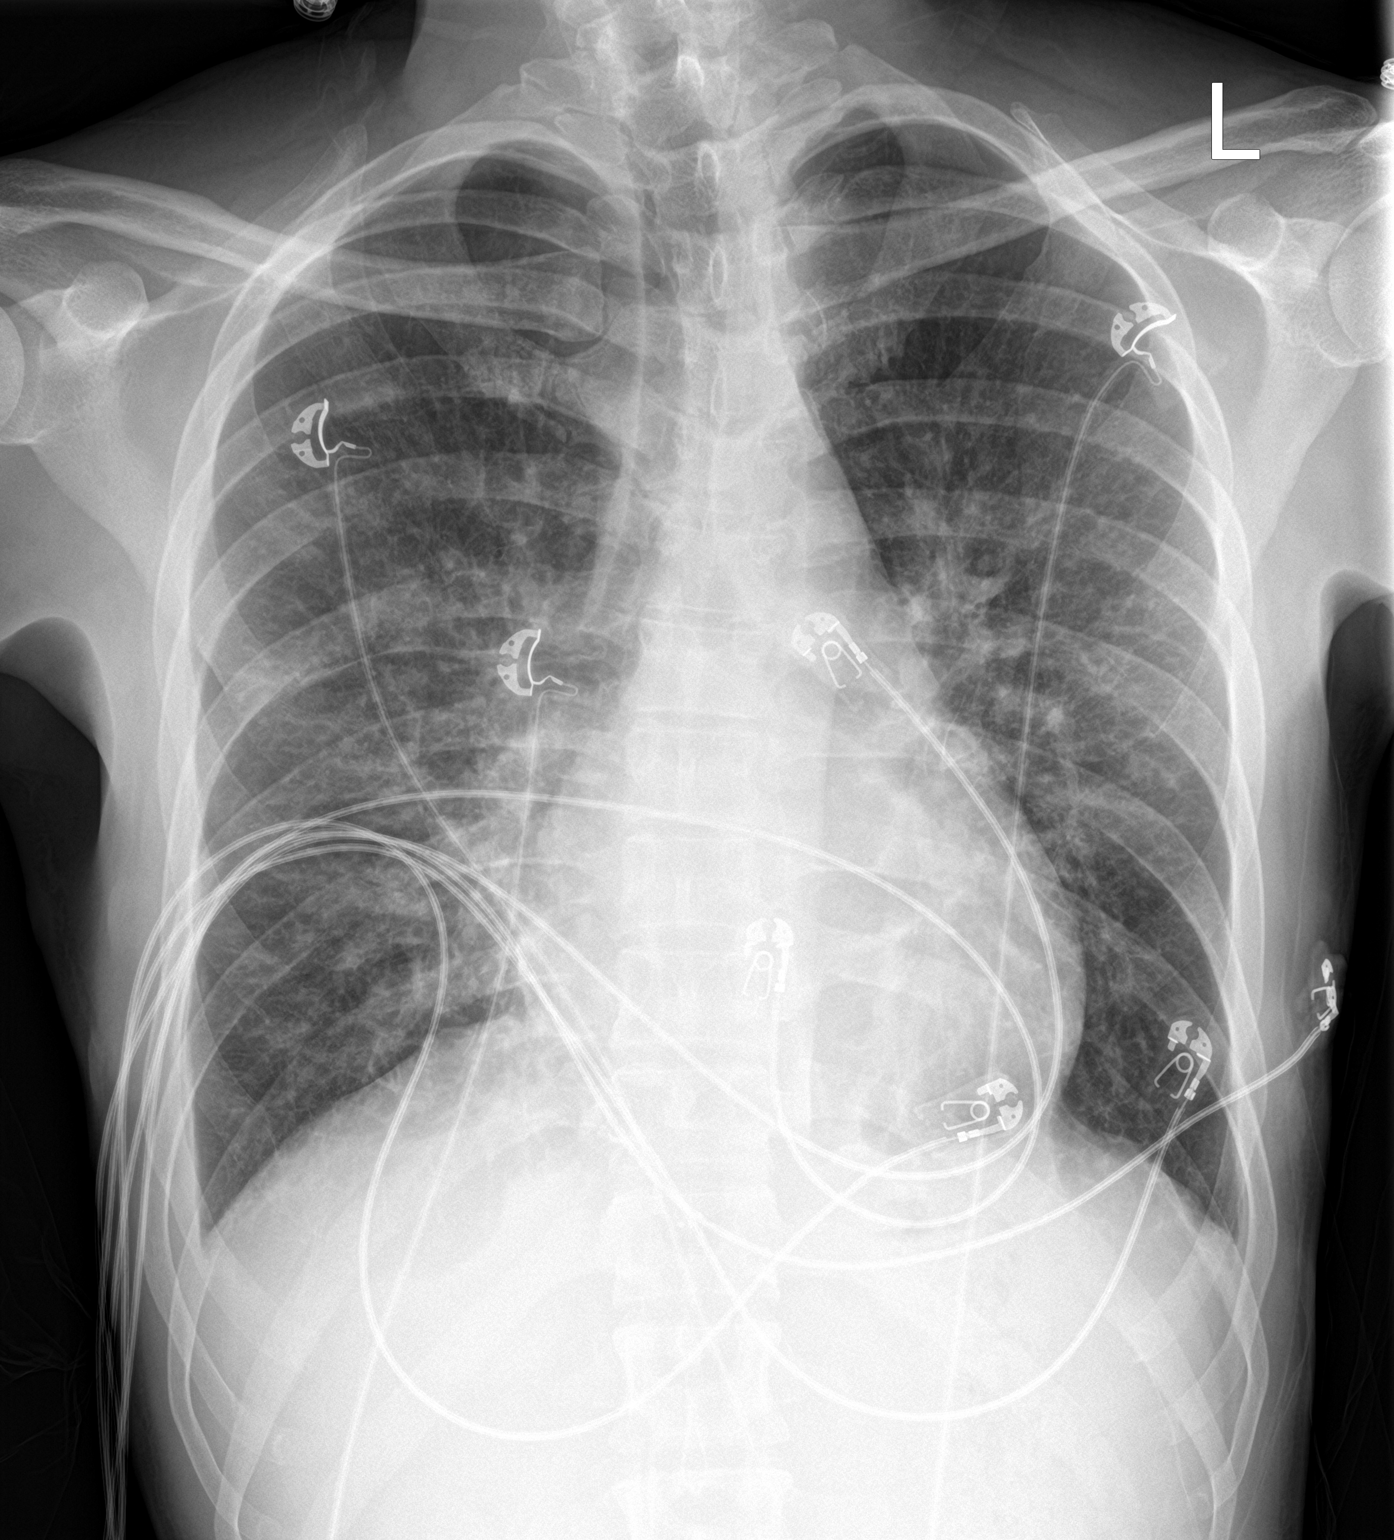

[chest lat]
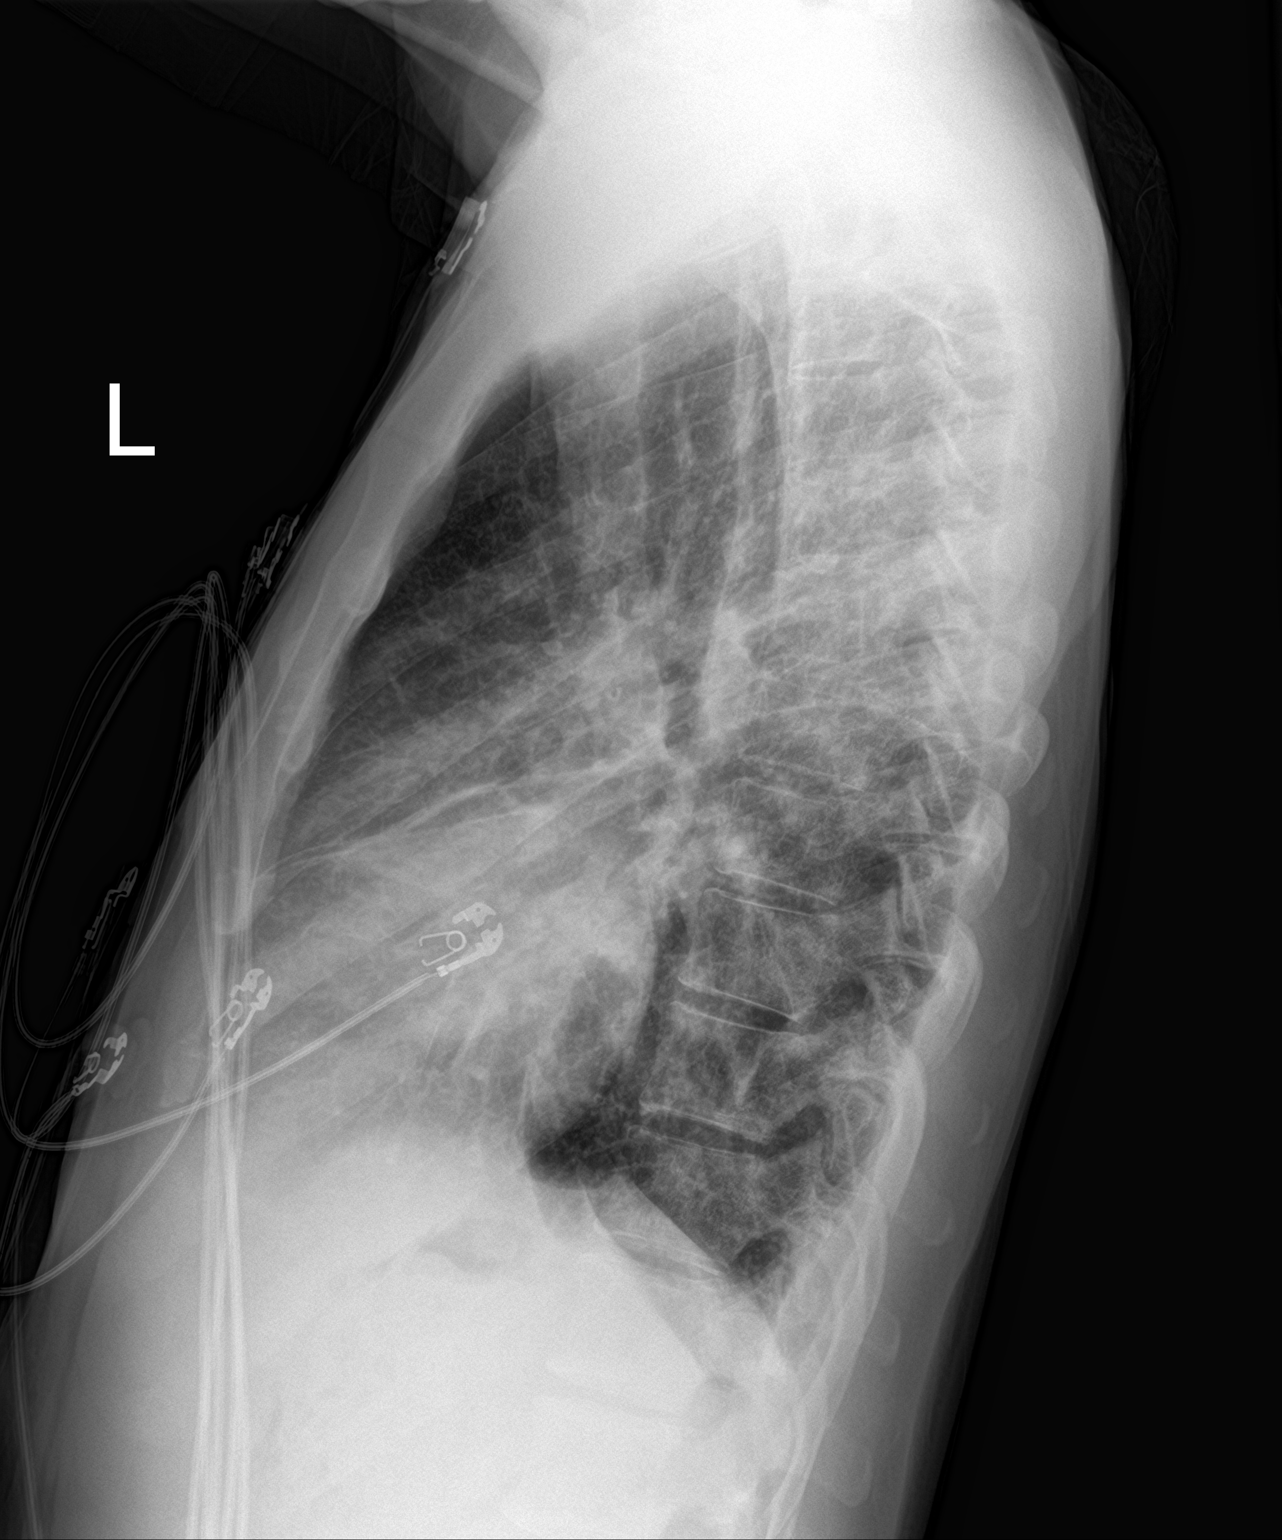

[2 of 2 positions shown; findings below may reference images not displayed]

FINDINGS: Normal heart size and pulmonary vascularity. There is peribronchial
thickening with mild bronchiectasis and perihilar and basilar
interstitial changes in the lungs. Findings demonstrate progression
since previous study. This likely indicates chronic bronchitis or
airways disease with superimposed acute bronchitis. Other
interstitial pneumonia could also have this appearance. No focal
consolidation. Blunting of the costophrenic angles is new since
prior study suggesting small pleural effusions. No pneumothorax.
Mediastinal contours appear intact.
IMPRESSION: Acute on chronic bronchitic changes with evidence of acute
peribronchial/ interstitial process.

## 2019-07-25 DEATH — deceased
